# Patient Record
Sex: Male | Born: 1985 | Race: White | Hispanic: No | Marital: Single | Smoking: Current every day smoker
Health system: Southern US, Community
[De-identification: ages and names within clinical notes are randomized; demographics above are authoritative.]

## PROBLEM LIST (undated history)

## (undated) DIAGNOSIS — F909 Attention-deficit hyperactivity disorder, unspecified type: Secondary | ICD-10-CM

## (undated) DIAGNOSIS — F1910 Other psychoactive substance abuse, uncomplicated: Secondary | ICD-10-CM

## (undated) DIAGNOSIS — F4310 Post-traumatic stress disorder, unspecified: Secondary | ICD-10-CM

## (undated) DIAGNOSIS — R569 Unspecified convulsions: Secondary | ICD-10-CM

## (undated) DIAGNOSIS — K5090 Crohn's disease, unspecified, without complications: Secondary | ICD-10-CM

## (undated) DIAGNOSIS — S92901A Unspecified fracture of right foot, initial encounter for closed fracture: Secondary | ICD-10-CM

## (undated) HISTORY — DX: Crohn's disease, unspecified, without complications: K50.90

## (undated) HISTORY — PX: OTHER SURGICAL HISTORY: SHX169

## (undated) HISTORY — PX: GALLBLADDER SURGERY: SHX652

---
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
## 2007-08-01 ENCOUNTER — Emergency Department (HOSPITAL_COMMUNITY): Admission: EM | Admit: 2007-08-01 | Discharge: 2007-08-01 | Payer: Self-pay | Admitting: Emergency Medicine

## 2008-08-22 ENCOUNTER — Emergency Department (HOSPITAL_COMMUNITY): Admission: EM | Admit: 2008-08-22 | Discharge: 2008-08-22 | Payer: Self-pay | Admitting: Family Medicine

## 2008-10-10 ENCOUNTER — Emergency Department (HOSPITAL_COMMUNITY): Admission: EM | Admit: 2008-10-10 | Discharge: 2008-10-10 | Payer: Self-pay | Admitting: Family Medicine

## 2008-12-19 ENCOUNTER — Emergency Department (HOSPITAL_COMMUNITY): Admission: EM | Admit: 2008-12-19 | Discharge: 2008-12-19 | Payer: Self-pay | Admitting: Family Medicine

## 2009-01-09 ENCOUNTER — Emergency Department (HOSPITAL_COMMUNITY): Admission: EM | Admit: 2009-01-09 | Discharge: 2009-01-09 | Payer: Self-pay | Admitting: Emergency Medicine

## 2009-05-22 ENCOUNTER — Ambulatory Visit: Payer: Self-pay | Admitting: Family Medicine

## 2009-05-22 ENCOUNTER — Encounter: Payer: Self-pay | Admitting: Sports Medicine

## 2009-05-22 DIAGNOSIS — K5090 Crohn's disease, unspecified, without complications: Secondary | ICD-10-CM

## 2009-05-22 DIAGNOSIS — G43909 Migraine, unspecified, not intractable, without status migrainosus: Secondary | ICD-10-CM

## 2009-05-22 DIAGNOSIS — F988 Other specified behavioral and emotional disorders with onset usually occurring in childhood and adolescence: Secondary | ICD-10-CM | POA: Insufficient documentation

## 2009-05-22 LAB — CONVERTED CEMR LAB
ALT: 47 units/L (ref 0–53)
AST: 20 units/L (ref 0–37)
Albumin: 4.5 g/dL (ref 3.5–5.2)
Alkaline Phosphatase: 55 units/L (ref 39–117)
BUN: 12 mg/dL (ref 6–23)
Basophils Absolute: 0 10*3/uL (ref 0.0–0.1)
Basophils Relative: 0 % (ref 0–1)
CO2: 25 meq/L (ref 19–32)
Calcium: 9.4 mg/dL (ref 8.4–10.5)
Chloride: 102 meq/L (ref 96–112)
Creatinine, Ser: 0.89 mg/dL (ref 0.40–1.50)
Eosinophils Absolute: 0.3 10*3/uL (ref 0.0–0.7)
Eosinophils Relative: 2 % (ref 0–5)
Glucose, Bld: 101 mg/dL — ABNORMAL HIGH (ref 70–99)
H Pylori IgG: NEGATIVE
HCT: 47.7 % (ref 39.0–52.0)
Hemoglobin: 16.5 g/dL (ref 13.0–17.0)
Lipase: 12 units/L (ref 0–75)
Lymphocytes Relative: 27 % (ref 12–46)
Lymphs Abs: 3.5 10*3/uL (ref 0.7–4.0)
MCHC: 34.6 g/dL (ref 30.0–36.0)
MCV: 97.5 fL (ref 78.0–100.0)
Monocytes Absolute: 0.9 10*3/uL (ref 0.1–1.0)
Monocytes Relative: 7 % (ref 3–12)
Neutro Abs: 8.5 10*3/uL — ABNORMAL HIGH (ref 1.7–7.7)
Neutrophils Relative %: 64 % (ref 43–77)
Platelets: 327 10*3/uL (ref 150–400)
Potassium: 4.5 meq/L (ref 3.5–5.3)
RBC: 4.89 M/uL (ref 4.22–5.81)
RDW: 12.7 % (ref 11.5–15.5)
Sodium: 142 meq/L (ref 135–145)
Total Bilirubin: 0.3 mg/dL (ref 0.3–1.2)
Total Protein: 6.9 g/dL (ref 6.0–8.3)
WBC: 13.2 10*3/uL — ABNORMAL HIGH (ref 4.0–10.5)

## 2009-05-23 ENCOUNTER — Encounter: Admission: RE | Admit: 2009-05-23 | Discharge: 2009-05-23 | Payer: Self-pay | Admitting: Sports Medicine

## 2009-06-20 ENCOUNTER — Emergency Department (HOSPITAL_COMMUNITY): Admission: EM | Admit: 2009-06-20 | Discharge: 2009-06-21 | Payer: Self-pay | Admitting: Emergency Medicine

## 2009-06-25 ENCOUNTER — Emergency Department (HOSPITAL_COMMUNITY): Admission: EM | Admit: 2009-06-25 | Discharge: 2009-06-25 | Payer: Self-pay | Admitting: Emergency Medicine

## 2009-07-19 ENCOUNTER — Emergency Department (HOSPITAL_COMMUNITY): Admission: EM | Admit: 2009-07-19 | Discharge: 2009-07-19 | Payer: Self-pay | Admitting: Emergency Medicine

## 2009-09-27 ENCOUNTER — Emergency Department (HOSPITAL_COMMUNITY): Admission: EM | Admit: 2009-09-27 | Discharge: 2009-09-27 | Payer: Self-pay | Admitting: Emergency Medicine

## 2010-02-11 ENCOUNTER — Emergency Department (HOSPITAL_COMMUNITY): Admission: EM | Admit: 2010-02-11 | Discharge: 2010-02-11 | Payer: Self-pay | Admitting: Emergency Medicine

## 2010-03-21 ENCOUNTER — Emergency Department (HOSPITAL_COMMUNITY): Admission: EM | Admit: 2010-03-21 | Discharge: 2010-03-21 | Payer: Self-pay | Admitting: Emergency Medicine

## 2010-03-22 ENCOUNTER — Telehealth: Payer: Self-pay | Admitting: Physician Assistant

## 2010-04-05 ENCOUNTER — Telehealth (INDEPENDENT_AMBULATORY_CARE_PROVIDER_SITE_OTHER): Payer: Self-pay | Admitting: *Deleted

## 2010-06-18 ENCOUNTER — Emergency Department (HOSPITAL_COMMUNITY)
Admission: EM | Admit: 2010-06-18 | Discharge: 2010-06-18 | Payer: Self-pay | Source: Home / Self Care | Admitting: Emergency Medicine

## 2010-06-20 LAB — D-DIMER, QUANTITATIVE: D-Dimer, Quant: 0.32 ug/mL-FEU (ref 0.00–0.48)

## 2010-06-24 ENCOUNTER — Encounter: Payer: Self-pay | Admitting: Internal Medicine

## 2010-06-24 ENCOUNTER — Emergency Department (HOSPITAL_COMMUNITY)
Admission: EM | Admit: 2010-06-24 | Discharge: 2010-06-24 | Payer: Self-pay | Source: Home / Self Care | Admitting: Emergency Medicine

## 2010-06-26 LAB — POCT I-STAT, CHEM 8
BUN: 10 mg/dL (ref 6–23)
Calcium, Ion: 1.12 mmol/L (ref 1.12–1.32)
Chloride: 107 mEq/L (ref 96–112)
Creatinine, Ser: 1.3 mg/dL (ref 0.4–1.5)
Glucose, Bld: 92 mg/dL (ref 70–99)
HCT: 48 % (ref 39.0–52.0)
Hemoglobin: 16.3 g/dL (ref 13.0–17.0)
Potassium: 4.2 mEq/L (ref 3.5–5.1)
Sodium: 142 mEq/L (ref 135–145)
TCO2: 27 mmol/L (ref 0–100)

## 2010-06-26 LAB — POCT CARDIAC MARKERS
CKMB, poc: <1 ng/mL — ABNORMAL LOW (ref 1.0–8.0)
Myoglobin, poc: 73.3 ng/mL (ref 12–200)
Troponin i, poc: <0.05 ng/mL (ref 0.00–0.09)

## 2010-07-03 NOTE — Assessment & Plan Note (Signed)
 Summary: np/father michael Marxen sees dr t/eo   Vital Signs:  Patient profile:   25 year old male Weight:      230 pounds Temp:     98.3 degrees F oral Pulse rate:   114 / minute Pulse rhythm:   regular BP sitting:   150 / 84  (right arm) Cuff size:   large  Vitals Entered By: Bascom Pica CMA (May 22, 2009 1:56 PM)  Primary Care Provider:  Debby Petties, MD   History of Present Illness: 25yo M, new pt eval, complaining of chronic diarrhea.  Worse over 6 mos but noted at first when he came back from [place] after a 2 year stay in 2008.  Associated with belly pain and bloating prior to bowel movement, loose and mucusy, one episode of hematochezia but no others, also has RUQ pain.  No known association with glutens, does drink lots of milk to ease his epigastric pain.  No contaminated water sources.  No fevers/chills.  Has not been losing weight.  No vomiting.  ADHD: Stable  Migraines:  [facility]ed by [facility], states he usually doesn't have enough money to go to appts.  Current Medications (verified): 1)  Methylphenidate  Hcl 20 Mg Tabs (Methylphenidate  Hcl) .... One Tab By Mouth Bid 2)  Atenolol 100 Mg Tabs (Atenolol) .... One Tab By Mouth Daily 3)  Topamax  100 Mg Tabs (Topiramate ) .... One Tab By Mouth Daily 4)  Fioricet 50-325-40 Mg Tabs (Butalbital-Apap-Caffeine) .... One Tab Po Q4-6h As Needed For Ha 5)  Imitrex Statdose System 4 Mg/0.5ml Kit (Sumatriptan Succinate) .... Injected At The First Sign of Migraines 6)  Prilosec 40 Mg Cpdr (Omeprazole ) .... One Tab By Mouth Daily  Allergies (verified): No Known Drug Allergies  Past History:  Past Medical History: Migraines (has been to neurologist and headache center) ADHD  Past Surgical History: None  Family History: Both parents alive. Chronic pancreatitis and ETOH abuse in father. Hx diabetes.  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  Review of Systems       See HPI  Physical Exam  General:  Well-developed,well-nourished,in no acute distress; alert,appropriate and cooperative throughout examination Head:  Normocephalic and atraumatic without obvious abnormalities. No apparent alopecia or balding. Eyes:  No corneal or conjunctival inflammation noted. EOMI. Perrla. Ears:  External ear exam shows no significant lesions or deformities.  Otoscopic examination reveals clear canals, tympanic membranes are intact bilaterally without bulging, retraction, inflammation or discharge. Hearing is grossly normal bilaterally. Nose:  External nasal examination shows no deformity or inflammation. Nasal mucosa are pink and moist without lesions or exudates. Mouth:  Oral mucosa and oropharynx without lesions or exudates.  Teeth in good repair. Lungs:  Normal respiratory effort, chest expands symmetrically. Lungs are clear to auscultation, no crackles or wheezes. Heart:  Normal rate and regular rhythm. S1 and S2 normal without gallop, murmur, click, rub or other extra sounds. Abdomen:  Bowel sounds positive,abdomen soft and without masses, organomegaly or hernias noted.  TTP RUQ and epigastrium. Msk:  No deformity or scoliosis noted of thoracic or lumbar spine.   Extremities:  No clubbing, cyanosis, edema, or deformity noted with normal full range of motion of all joints.   Neurologic:  No cranial nerve deficits noted. Station and gait are normal. Plantar reflexes are down-going bilaterally. DTRs are symmetrical throughout. Sensory, motor and coordinative functions appear intact. Skin:  Intact without suspicious lesions or  rashes   Impression & Recommendations:  Problem # 1:  DIARRHEA, CHRONIC (ICD-787.91) Assessment New Present >6 months, hold lactose containing foods, will check below labs and imaging studies.  Stool O&P ordered as future study.  Suspect gallbladder etiology vs. lactose intolerance vs. IBD.  If all  studies neg can consider HIDA.  If negative could pursue workup for rarer etiologies such as Celiac sprue, Leishmaniasis.  Would also consider course of Flagyl if everything negative.  Will start prilosec 40 once daily.  Orders: Comp Met-[facility] [phone]) CBC w/Diff-[facility] [phone]) H pylori-[facility] [phone]) Lipase-[facility] [phone]) Sed Rate (ESR)-[facility] [phone]) Ultrasound (Ultrasound)  Problem # 2:  MIGRAINE HEADACHE (ICD-346.90) Assessment: New Didn't take any of his medicines today.    His updated medication list for this problem includes:    Atenolol 100 Mg Tabs (Atenolol) ..... One tab by mouth daily    Fioricet 50-325-40 Mg Tabs (Butalbital-apap-caffeine) ..... One tab po q4-6h as needed for ha    Imitrex Statdose System 4 Mg/0.5ml Kit (Sumatriptan succinate) ..... Injected at the first sign of migraines  Problem # 3:  ATTENTION DEFICIT DISORDER, ADULT (ICD-314.00) Assessment: New Stable, on ritalin .  Complete Medication List: 1)  Methylphenidate  Hcl 20 Mg Tabs (Methylphenidate  hcl) .... One tab by mouth bid 2)  Atenolol 100 Mg Tabs (Atenolol) .... One tab by mouth daily 3)  Topamax  100 Mg Tabs (Topiramate ) .... One tab by mouth daily 4)  Fioricet 50-325-40 Mg Tabs (Butalbital-apap-caffeine) .... One tab po q4-6h as needed for ha 5)  Imitrex Statdose System 4 Mg/0.5ml Kit (Sumatriptan succinate) .... Injected at the first sign of migraines 6)  Prilosec 40 Mg Cpdr (Omeprazole ) .... One tab by mouth daily  Other Orders: Future Orders: Stool Giardia/Cryptosporidium-[facility] ([id number]) ... 05/22/2010 Stool, WBC/Lactoferrin-[facility] ([id number]) ... 05/22/2010  Patient Instructions: 1)  Great to see you today, 2)  We will check some bloodwork, I also need you to provide a stool sample (you will collect at home then drop it off at the lab). 3)  Ultrasound of your abdomen. 4)  Stop drinking so much milk, ok to drink Lactaid milk. 5)  Come back to see me in 1 month to go over all the results and the  ultrasound results. Will reassess your symptoms then and proceed with further testing as needed. 6)  Prilosec 40mg  daily. 7)  -Dr. ONEIDA. Prescriptions: PRILOSEC 40 MG CPDR (OMEPRAZOLE ) One tab by mouth daily  #30 x 6   Entered and Authorized by:   Debby Petties MD   Signed by:   Debby Petties MD on 05/22/2009   Method used:   Electronically to        [id number] (retail)       [address]       [place], [place]  [address]       Ph: [phone] or [phone]       Fax: [phone]   RxID:   [id number]   Laboratory Results   Blood Tests   Date/Time Received: May 22, 2009 3:24 PM  Date/Time Reported: May 22, 2009 4:57 PM   SED rate: 0 mm/hr  H. pylori: negative Comments: ...........test performed by...........SABRAArland Morel, CMA      Flu Vaccine Result Date:  06/03/2008 Flu Vaccine Result:  given Flu Vaccine Next Due:  1 yr TD Result Date:  06/03/2008 TD Result:  given TD Next Due:  10 yr

## 2010-07-03 NOTE — Progress Notes (Signed)
Summary: resch appt  Phone Note Call from Patient Call back at [phone]   Caller: Patient Call For: Mike Gip, PA Reason for Call: Talk to Nurse Summary of Call: this pt cancelled his appt for today with Amy due to the fact that he does not have his insurance information and concerned about proper billing... wanted to resch, but Amy does not have schedule open... pt would like a call from Amy's nurse to resch when available Initial call taken by: Vallarie Mare,  March 22, 2010 8:52 AM  Follow-up for Phone Call        Called pt's cell phone and LM for him to call our office and reschedule.  I advised him Amy doesn't have a scheduled in yet for Nov.  I let him know o ur Nurse Practioner has appts open for next week, week of 03-26-10.   Follow-up by: Joselyn Glassman,  March 23, 2010 9:08 AM

## 2010-07-03 NOTE — Progress Notes (Signed)
[facility]  Phone Note Outgoing Call   Call placed by: June McMurray CMA Duncan Dull),  April 05, 2010 4:07 PM Call placed to: Patient Action Taken: Phone Call Completed Summary of Call: Called patient to reschedule follow-up appointment from hospital [facility], [place], patient stated that [facility] and did not require our services.

## 2010-08-06 ENCOUNTER — Emergency Department (HOSPITAL_COMMUNITY)
Admission: EM | Admit: 2010-08-06 | Discharge: 2010-08-06 | Disposition: A | Discharge status: Home / Self Care | Payer: Self-pay | Attending: Emergency Medicine | Admitting: Emergency Medicine

## 2010-08-19 LAB — CSF CELL COUNT WITH DIFFERENTIAL
RBC Count, CSF: 1 /mm3 — ABNORMAL HIGH
RBC Count, CSF: 7 /mm3 — ABNORMAL HIGH
Tube #: 1
Tube #: 4
WBC, CSF: 1 /mm3 (ref 0–5)
WBC, CSF: 6 /mm3 — ABNORMAL HIGH (ref 0–5)

## 2010-08-19 LAB — DIFFERENTIAL
Basophils Absolute: 0 10*3/uL (ref 0.0–0.1)
Basophils Relative: 0 % (ref 0–1)
Eosinophils Absolute: 0.1 10*3/uL (ref 0.0–0.7)
Eosinophils Relative: 1 % (ref 0–5)
Lymphocytes Relative: 9 % — ABNORMAL LOW (ref 12–46)
Lymphs Abs: 1.8 10*3/uL (ref 0.7–4.0)
Monocytes Absolute: 0.9 10*3/uL (ref 0.1–1.0)
Monocytes Relative: 5 % (ref 3–12)
Neutro Abs: 16.2 10*3/uL — ABNORMAL HIGH (ref 1.7–7.7)
Neutrophils Relative %: 85 % — ABNORMAL HIGH (ref 43–77)

## 2010-08-19 LAB — BASIC METABOLIC PANEL
BUN: 11 mg/dL (ref 6–23)
CO2: 25 mEq/L (ref 19–32)
Calcium: 8.9 mg/dL (ref 8.4–10.5)
Chloride: 101 mEq/L (ref 96–112)
Creatinine, Ser: 0.93 mg/dL (ref 0.4–1.5)
GFR calc Af Amer: >60 mL/min (ref >60)
GFR calc non Af Amer: >60 mL/min (ref >60)
Glucose, Bld: 126 mg/dL — ABNORMAL HIGH (ref 70–99)
Potassium: 3.8 mEq/L (ref 3.5–5.1)
Sodium: 135 mEq/L (ref 135–145)

## 2010-08-19 LAB — PROTEIN AND GLUCOSE, CSF
Glucose, CSF: 72 mg/dL (ref 43–76)
Total  Protein, CSF: 39 mg/dL (ref 15–45)

## 2010-08-19 LAB — CSF CULTURE W GRAM STAIN: Culture: NO GROWTH

## 2010-08-19 LAB — CBC
HCT: 45.9 % (ref 39.0–52.0)
Hemoglobin: 15.9 g/dL (ref 13.0–17.0)
MCHC: 34.6 g/dL (ref 30.0–36.0)
MCV: 99.3 fL (ref 78.0–100.0)
Platelets: 292 10*3/uL (ref 150–400)
RBC: 4.62 MIL/uL (ref 4.22–5.81)
RDW: 12.8 % (ref 11.5–15.5)
WBC: 19 10*3/uL — ABNORMAL HIGH (ref 4.0–10.5)

## 2010-08-21 ENCOUNTER — Encounter: Payer: Self-pay | Admitting: Sports Medicine

## 2010-08-21 ENCOUNTER — Ambulatory Visit (INDEPENDENT_AMBULATORY_CARE_PROVIDER_SITE_OTHER): Payer: Self-pay | Admitting: Sports Medicine

## 2010-08-21 DIAGNOSIS — K5090 Crohn's disease, unspecified, without complications: Secondary | ICD-10-CM

## 2010-08-21 DIAGNOSIS — J069 Acute upper respiratory infection, unspecified: Secondary | ICD-10-CM

## 2010-08-21 DIAGNOSIS — F988 Other specified behavioral and emotional disorders with onset usually occurring in childhood and adolescence: Secondary | ICD-10-CM

## 2010-08-21 MED ORDER — METHYLPHENIDATE HCL 20 MG PO TABS: 20.0000 mg | ORAL_TABLET | Freq: Two times a day (BID) | ORAL | Status: DC

## 2010-08-21 NOTE — Assessment & Plan Note (Signed)
Refilled ritalin

## 2010-08-21 NOTE — Progress Notes (Signed)
  Subjective:    Patient ID: Justin [name], male    DOB: [birth year], 25 y.o.   MRN: [id number]  HPI URI Symptoms Onset: 1 week Description: cough, nasal congestion, sniffles  Symptoms Nasal discharge: clear Fever: no Sore throat:no  Cough: yes, brownish sputum Wheezing: no Ear pain: minimal GI symptoms: diarrhea, but has dx Crohns disease. Sick contacts: no  Red Flags  Stiff neck: NO Dyspnea: NO Rash: NO Swallowing difficulty: NO  Sinusitis Risk Factors Headache/face pain: NO Double sickening: NO tooth pain: NO  Allergy Risk Factors Sneezing: YES Itchy scratchy throat: NO Seasonal symptoms: YES  Flu Risk Factors Headache: NO muscle aches: NO severe fatigue: NO  We did discuss his father's recent and sudden death.  He is coping well and able to discuss this.   Review of Systems     Objective:   Physical Exam  Constitutional: He appears well-developed and well-nourished. No distress.  HENT:  Head: Normocephalic and atraumatic.  Right Ear: External ear normal.  Left Ear: External ear normal.  Mouth/Throat: Oropharynx is clear and moist. No oropharyngeal exudate.  Eyes: Conjunctivae and EOM are normal. Pupils are equal, round, and reactive to light.  Neck: Normal range of motion. Neck supple. No JVD present.  Cardiovascular: Normal rate, regular rhythm, normal heart sounds and intact distal pulses.  Exam reveals no gallop and no friction rub.   No murmur heard. Pulmonary/Chest: Effort normal and breath sounds normal. No stridor. No respiratory distress. He has no wheezes. He has no rales. He exhibits no tenderness.  Abdominal: Soft.  Lymphadenopathy:    He has no cervical adenopathy.  Skin: Skin is warm and dry.          Assessment & Plan:

## 2010-08-21 NOTE — Patient Instructions (Addendum)
You have a viral infection. Take theraflu as directed, including the nighttime pack. -Dr. Karie Schwalbe.

## 2010-08-21 NOTE — Assessment & Plan Note (Signed)
Recommended OTC agents. Handout given. RTC prn.

## 2010-08-21 NOTE — Assessment & Plan Note (Signed)
Has a GI doctor, will be starting mesalamine soon.

## 2010-09-08 LAB — GLUCOSE, CAPILLARY: Glucose-Capillary: 102 mg/dL — ABNORMAL HIGH (ref 70–99)

## 2010-10-10 ENCOUNTER — Telehealth: Payer: Self-pay | Admitting: Sports Medicine

## 2010-10-10 MED ORDER — METHYLPHENIDATE HCL 20 MG PO TABS: 20.0000 mg | ORAL_TABLET | Freq: Two times a day (BID) | ORAL | Status: DC

## 2010-10-10 NOTE — Telephone Encounter (Signed)
Please refill rx and contact pt when ready for pickup

## 2010-10-10 NOTE — Telephone Encounter (Signed)
Script at front

## 2010-10-12 ENCOUNTER — Encounter: Payer: Self-pay | Admitting: Sports Medicine

## 2010-10-12 ENCOUNTER — Ambulatory Visit (INDEPENDENT_AMBULATORY_CARE_PROVIDER_SITE_OTHER): Payer: Self-pay | Admitting: Sports Medicine

## 2010-10-12 DIAGNOSIS — F988 Other specified behavioral and emotional disorders with onset usually occurring in childhood and adolescence: Secondary | ICD-10-CM

## 2010-10-12 DIAGNOSIS — K5090 Crohn's disease, unspecified, without complications: Secondary | ICD-10-CM

## 2010-10-12 DIAGNOSIS — G43909 Migraine, unspecified, not intractable, without status migrainosus: Secondary | ICD-10-CM

## 2010-10-12 MED ORDER — MESALAMINE 400 MG PO TBEC: 400.0000 mg | DELAYED_RELEASE_TABLET | Freq: Three times a day (TID) | ORAL | Status: DC

## 2010-10-12 MED ORDER — RANITIDINE HCL 150 MG PO TABS: 150.0000 mg | ORAL_TABLET | Freq: Two times a day (BID) | ORAL | Status: DC

## 2010-10-12 MED ORDER — HYDROCODONE-ACETAMINOPHEN 10-325 MG PO TABS: 1.0000 | ORAL_TABLET | ORAL | Status: AC | PRN

## 2010-10-12 MED ORDER — TOPIRAMATE 100 MG PO TABS: 150.0000 mg | ORAL_TABLET | Freq: Every day | ORAL | Status: DC

## 2010-10-12 MED ORDER — PREDNISONE 5 MG PO TABS: 5.0000 mg | ORAL_TABLET | Freq: Every day | ORAL | Status: AC

## 2010-10-12 NOTE — Patient Instructions (Signed)
Great to see you Abdullahi. Let me know if you need anything else. Call us for refills.  Ihor Austin. Benjamin Stain, M.D.

## 2010-10-12 NOTE — Progress Notes (Signed)
  Subjective:    Patient ID: Justin [name], male    DOB: [birth year], 25 y.o.   MRN: [id number]  HPI 25 yo male here for fu.  Crohn's Disease:  [facility]  On Mesalamine and prednisone.  Symptoms overall controlled, no abd pain, no blood diarrhea today.  ADHD:  Stable on ritalin 20 BID.  Migraines:  Well controlled with topamax, hydrocodone for breakthrough.  Off imitrex 2/2 Crohn's meds.   Review of Systems    See HPI Objective:   Physical Exam  Constitutional: He appears well-developed and well-nourished. No distress.  Cardiovascular: Normal rate, regular rhythm and normal heart sounds.  Exam reveals no gallop and no friction rub.   No murmur heard. Pulmonary/Chest: Effort normal and breath sounds normal. No respiratory distress. He has no wheezes. He has no rales. He exhibits no tenderness.  Skin: Skin is warm and dry.          Assessment & Plan:

## 2010-10-12 NOTE — Assessment & Plan Note (Signed)
Refilled Ritalin on 5/9, 1 month supply, may get refills q monthly.

## 2010-10-12 NOTE — Assessment & Plan Note (Signed)
[facility] On mesalamine and prednisone.

## 2010-10-12 NOTE — Assessment & Plan Note (Signed)
Controlled with Topamax and hydrocodone.

## 2010-11-05 ENCOUNTER — Telehealth: Payer: Self-pay | Admitting: Sports Medicine

## 2010-11-05 NOTE — Telephone Encounter (Signed)
Is going out of town on Wed and needs refill on his Hydrocodone 10mg  & methyphenidate

## 2010-11-05 NOTE — Telephone Encounter (Signed)
The new practice policy is pts need to come in for a office visit for controlled substances.  I can no longer leave this in an envelope in the front so have him come see me at my next avail slot and I can write a script.  We will not be assessing other problems during this office visit. 

## 2010-11-06 ENCOUNTER — Telehealth: Payer: Self-pay | Admitting: Sports Medicine

## 2010-11-06 MED ORDER — METHYLPHENIDATE HCL 20 MG PO TABS: 20.0000 mg | ORAL_TABLET | Freq: Two times a day (BID) | ORAL | Status: DC

## 2010-11-06 MED ORDER — HYDROCODONE-ACETAMINOPHEN 10-325 MG PO TABS: 1.0000 | ORAL_TABLET | Freq: Four times a day (QID) | ORAL | Status: AC | PRN

## 2010-11-06 NOTE — Telephone Encounter (Signed)
err

## 2010-11-06 NOTE — Telephone Encounter (Signed)
In script at front.

## 2010-11-06 NOTE — Telephone Encounter (Signed)
LVM for patient to call back to inform of below 

## 2010-11-06 NOTE — Telephone Encounter (Signed)
Spoke with patient and he stated that he was prescribed hydrocodone before. He was prescribed it on 5/11 by you

## 2010-11-06 NOTE — Telephone Encounter (Signed)
Don't see hydrocodone on his med list.  Methylphenidate in envelope at front.

## 2010-12-17 ENCOUNTER — Telehealth: Payer: Self-pay | Admitting: Family Medicine

## 2010-12-17 NOTE — Telephone Encounter (Signed)
Needs refills of Hydrocodone and Methylthenidate, please call when they are ready for pick up.

## 2010-12-17 NOTE — Telephone Encounter (Signed)
Called pt and lvm for him to call back.  He will need to make an appt before any meds can be refilled.Loralee Pacas [place]

## 2010-12-29 ENCOUNTER — Encounter: Payer: Self-pay | Admitting: Family Medicine

## 2011-01-01 ENCOUNTER — Ambulatory Visit: Payer: Self-pay | Admitting: Family Medicine

## 2011-01-16 ENCOUNTER — Emergency Department (HOSPITAL_COMMUNITY)
Admission: EM | Admit: 2011-01-16 | Discharge: 2011-01-16 | Disposition: A | Discharge status: Home / Self Care | Payer: Non-veteran care | Attending: Emergency Medicine | Admitting: Emergency Medicine

## 2011-01-16 DIAGNOSIS — K2270 Barrett's esophagus without dysplasia: Secondary | ICD-10-CM | POA: Insufficient documentation

## 2011-01-16 DIAGNOSIS — R1110 Vomiting, unspecified: Secondary | ICD-10-CM | POA: Insufficient documentation

## 2011-01-16 DIAGNOSIS — K219 Gastro-esophageal reflux disease without esophagitis: Secondary | ICD-10-CM | POA: Insufficient documentation

## 2011-01-16 DIAGNOSIS — R1032 Left lower quadrant pain: Secondary | ICD-10-CM | POA: Insufficient documentation

## 2011-01-16 LAB — COMPREHENSIVE METABOLIC PANEL
Alkaline Phosphatase: 54 U/L (ref 39–117)
BUN: 9 mg/dL (ref 6–23)
CO2: 26 mEq/L (ref 19–32)
Calcium: 9.2 mg/dL (ref 8.4–10.5)
GFR calc Af Amer: >60 mL/min (ref >60)
GFR calc non Af Amer: >60 mL/min (ref >60)
Glucose, Bld: 97 mg/dL (ref 70–99)
Potassium: 4.1 mEq/L (ref 3.5–5.1)
Total Protein: 6.9 g/dL (ref 6.0–8.3)

## 2011-01-16 LAB — LIPASE, BLOOD: Lipase: 18 U/L (ref 11–59)

## 2011-01-16 LAB — DIFFERENTIAL
Eosinophils Absolute: 0.3 10*3/uL (ref 0.0–0.7)
Eosinophils Relative: 4 % (ref 0–5)
Lymphocytes Relative: 37 % (ref 12–46)
Lymphs Abs: 2.7 10*3/uL (ref 0.7–4.0)
Monocytes Relative: 11 % (ref 3–12)

## 2011-01-16 LAB — URINALYSIS, ROUTINE W REFLEX MICROSCOPIC
Bilirubin Urine: NEGATIVE
Glucose, UA: NEGATIVE mg/dL
Hgb urine dipstick: NEGATIVE
Ketones, ur: NEGATIVE mg/dL
Protein, ur: NEGATIVE mg/dL

## 2011-01-16 LAB — CBC
HCT: 45.4 % (ref 39.0–52.0)
MCH: 34 pg (ref 26.0–34.0)
MCV: 96.6 fL (ref 78.0–100.0)
Platelets: 247 10*3/uL (ref 150–400)
RDW: 12.4 % (ref 11.5–15.5)

## 2011-04-28 ENCOUNTER — Encounter (HOSPITAL_COMMUNITY): Payer: Self-pay | Admitting: *Deleted

## 2011-04-28 ENCOUNTER — Emergency Department (INDEPENDENT_AMBULATORY_CARE_PROVIDER_SITE_OTHER)
Admission: EM | Admit: 2011-04-28 | Discharge: 2011-04-28 | Disposition: A | Discharge status: Home / Self Care | Payer: Non-veteran care | Source: Home / Self Care | Attending: Emergency Medicine | Admitting: Emergency Medicine

## 2011-04-28 DIAGNOSIS — J069 Acute upper respiratory infection, unspecified: Secondary | ICD-10-CM

## 2011-04-28 HISTORY — DX: Attention-deficit hyperactivity disorder, unspecified type: F90.9

## 2011-04-28 MED ORDER — PREDNISONE (PAK) 10 MG PO TABS: 20.0000 mg | ORAL_TABLET | Freq: Every day | ORAL | Status: AC

## 2011-04-28 MED ORDER — GUAIFENESIN-CODEINE 100-10 MG/5ML PO SYRP: 5.0000 mL | ORAL_SOLUTION | Freq: Three times a day (TID) | ORAL | Status: AC | PRN

## 2011-04-28 NOTE — ED Provider Notes (Signed)
History     CSN: [id number] Arrival date & time: 04/28/2011 11:18 AM   First MD Initiated Contact with Patient 04/28/11 1047      Chief Complaint  Patient presents with  . Fever    friday onset of fever/cough/congestion - onset of right rib cage pain this am worse with coughing   . Nasal Congestion  . Cough    (Consider location/radiation/quality/duration/timing/severity/associated sxs/prior treatment) Patient is a 25 y.o. male presenting with fever and cough. The history is provided by the patient.  Fever Primary symptoms of the febrile illness include fever, cough and vomiting. Primary symptoms do not include headaches, wheezing, shortness of breath, nausea, arthralgias or rash. The current episode started 2 days ago. This is a new problem.  Cough Associated symptoms include sore throat. Pertinent negatives include no ear pain, no headaches, no shortness of breath and no wheezing.    Past Medical History  Diagnosis Date  . Crohn's disease   . ADHD (attention deficit hyperactivity disorder)     History reviewed. No pertinent past surgical history.  History reviewed. No pertinent family history.  History  Substance Use Topics  . Smoking status: Never Smoker   . Smokeless tobacco: Not on file  . Alcohol Use: No      Review of Systems  Constitutional: Positive for fever.  HENT: Positive for congestion, sore throat and postnasal drip. Negative for ear pain.   Respiratory: Positive for cough. Negative for shortness of breath and wheezing.   Gastrointestinal: Positive for vomiting. Negative for nausea.  Musculoskeletal: Negative for arthralgias.  Skin: Negative for rash.  Neurological: Negative for headaches.    Allergies  Review of patient's allergies indicates no known allergies.  Home Medications   Current Outpatient Rx  Name Route Sig Dispense Refill  . ACETAMINOPHEN 325 MG PO TABS Oral Take 650 mg by mouth every 6 (six) hours as needed.      Marland Kitchen OVER THE  COUNTER MEDICATION  dayquil nyquil     . MESALAMINE 400 MG PO TBEC Oral Take 1 tablet (400 mg total) by mouth 3 (three) times daily. 90 tablet 11  . METHYLPHENIDATE HCL 20 MG PO TABS Oral Take 1 tablet (20 mg total) by mouth 2 (two) times daily. 60 tablet 0  . RANITIDINE HCL 150 MG PO TABS Oral Take 1 tablet (150 mg total) by mouth 2 (two) times daily. 60 tablet 1  . TOPIRAMATE 100 MG PO TABS Oral Take 1.5 tablets (150 mg total) by mouth daily. 45 tablet 11    BP 138/89  Pulse 106  Temp(Src) 98.6 F (37 C) (Oral)  Resp 18  SpO2 98%  Physical Exam  Nursing note and vitals reviewed. Constitutional: He appears well-developed.  Eyes: Pupils are equal, round, and reactive to light.  Neck: Neck supple.  Cardiovascular: Normal rate.  Exam reveals no gallop.   Pulmonary/Chest: Effort normal. No respiratory distress. He has no wheezes. He has no rales. He exhibits no tenderness.  Skin: Skin is warm.    ED Course  Procedures (including critical care time)  Labs Reviewed - No data to display No results found.   No diagnosis found.    MDM  COUGH- AND RHINITIS WITH FEVERS AT HOME- PLEURITIC CHEST PAIN- NORMAL EXMA-        Jimmie Molly, MD 04/28/11 1200

## 2012-02-28 ENCOUNTER — Emergency Department (INDEPENDENT_AMBULATORY_CARE_PROVIDER_SITE_OTHER)
Admission: EM | Admit: 2012-02-28 | Discharge: 2012-02-28 | Disposition: A | Discharge status: Nursing Home (Includes ICF) | Payer: Managed Care, Other (non HMO) | Source: Home / Self Care | Attending: Family Medicine | Admitting: Family Medicine

## 2012-02-28 ENCOUNTER — Emergency Department (HOSPITAL_COMMUNITY): Payer: Managed Care, Other (non HMO)

## 2012-02-28 ENCOUNTER — Emergency Department (HOSPITAL_COMMUNITY)
Admission: EM | Admit: 2012-02-28 | Discharge: 2012-02-28 | Disposition: A | Discharge status: Home / Self Care | Payer: Managed Care, Other (non HMO) | Attending: Emergency Medicine | Admitting: Emergency Medicine

## 2012-02-28 ENCOUNTER — Encounter (HOSPITAL_COMMUNITY): Payer: Self-pay | Admitting: Emergency Medicine

## 2012-02-28 ENCOUNTER — Encounter (HOSPITAL_COMMUNITY): Payer: Self-pay | Admitting: Family Medicine

## 2012-02-28 DIAGNOSIS — R109 Unspecified abdominal pain: Secondary | ICD-10-CM | POA: Insufficient documentation

## 2012-02-28 DIAGNOSIS — Z79899 Other long term (current) drug therapy: Secondary | ICD-10-CM | POA: Insufficient documentation

## 2012-02-28 DIAGNOSIS — K5010 Crohn's disease of large intestine without complications: Secondary | ICD-10-CM

## 2012-02-28 DIAGNOSIS — R52 Pain, unspecified: Secondary | ICD-10-CM

## 2012-02-28 DIAGNOSIS — K5090 Crohn's disease, unspecified, without complications: Secondary | ICD-10-CM | POA: Insufficient documentation

## 2012-02-28 DIAGNOSIS — R112 Nausea with vomiting, unspecified: Secondary | ICD-10-CM | POA: Insufficient documentation

## 2012-02-28 DIAGNOSIS — R197 Diarrhea, unspecified: Secondary | ICD-10-CM | POA: Insufficient documentation

## 2012-02-28 LAB — CBC WITH DIFFERENTIAL/PLATELET
Basophils Absolute: 0 10*3/uL (ref 0.0–0.1)
Basophils Relative: 0 % (ref 0–1)
Eosinophils Absolute: 0.1 10*3/uL (ref 0.0–0.7)
Hemoglobin: 15.2 g/dL (ref 13.0–17.0)
MCHC: 34.2 g/dL (ref 30.0–36.0)
Monocytes Relative: 5 % (ref 3–12)
Neutro Abs: 10.1 10*3/uL — ABNORMAL HIGH (ref 1.7–7.7)
Neutrophils Relative %: 74 % (ref 43–77)
Platelets: 289 10*3/uL (ref 150–400)

## 2012-02-28 LAB — COMPREHENSIVE METABOLIC PANEL WITH GFR
ALT: 14 U/L (ref 0–53)
AST: 10 U/L (ref 0–37)
Albumin: 4.1 g/dL (ref 3.5–5.2)
Alkaline Phosphatase: 47 U/L (ref 39–117)
BUN: 10 mg/dL (ref 6–23)
CO2: 28 meq/L (ref 19–32)
Calcium: 9.7 mg/dL (ref 8.4–10.5)
Chloride: 105 meq/L (ref 96–112)
Creatinine, Ser: 0.93 mg/dL (ref 0.50–1.35)
GFR calc Af Amer: >90 mL/min
GFR calc non Af Amer: >90 mL/min
Glucose, Bld: 96 mg/dL (ref 70–99)
Potassium: 4.1 meq/L (ref 3.5–5.1)
Sodium: 142 meq/L (ref 135–145)
Total Bilirubin: 0.4 mg/dL (ref 0.3–1.2)
Total Protein: 7 g/dL (ref 6.0–8.3)

## 2012-02-28 MED ORDER — IOHEXOL 300 MG/ML  SOLN
80.0000 mL | Freq: Once | INTRAMUSCULAR | Status: AC | PRN
  Administered 2012-02-28: 80 mL via INTRAVENOUS

## 2012-02-28 MED ORDER — ONDANSETRON 4 MG PO TBDP
ORAL_TABLET | ORAL | Status: AC
  Filled 2012-02-28: qty 1

## 2012-02-28 MED ORDER — ONDANSETRON 8 MG PO TBDP: 8.0000 mg | ORAL_TABLET | Freq: Three times a day (TID) | ORAL | Status: DC | PRN

## 2012-02-28 MED ORDER — FENTANYL CITRATE 0.05 MG/ML IJ SOLN
50.0000 ug | Freq: Once | INTRAMUSCULAR | Status: AC
  Administered 2012-02-28: 50 ug via INTRAVENOUS
  Filled 2012-02-28: qty 2

## 2012-02-28 MED ORDER — SODIUM CHLORIDE 0.9 % IV BOLUS (SEPSIS)
500.0000 mL | Freq: Once | INTRAVENOUS | Status: AC
  Administered 2012-02-28: 500 mL via INTRAVENOUS

## 2012-02-28 MED ORDER — ONDANSETRON HCL 4 MG/2ML IJ SOLN
4.0000 mg | Freq: Once | INTRAMUSCULAR | Status: AC
  Administered 2012-02-28: 4 mg via INTRAVENOUS
  Filled 2012-02-28: qty 2

## 2012-02-28 MED ORDER — ONDANSETRON 4 MG PO TBDP
8.0000 mg | ORAL_TABLET | Freq: Once | ORAL | Status: AC
  Administered 2012-02-28: 8 mg via ORAL

## 2012-02-28 MED ORDER — HYDROCODONE-ACETAMINOPHEN 5-325 MG PO TABS
ORAL_TABLET | ORAL | Status: AC
  Filled 2012-02-28: qty 1

## 2012-02-28 MED ORDER — HYDROCODONE-ACETAMINOPHEN 5-325 MG PO TABS
1.0000 | ORAL_TABLET | Freq: Once | ORAL | Status: AC
  Administered 2012-02-28: 1 via ORAL

## 2012-02-28 NOTE — ED Notes (Signed)
Pt having severe nausea, vomiting and diarrhea for 2 days. The vomiting comes on suddenly when not eating. Pt reports a home temp of 101.8, denies any suspicious foods. Pt reports severe right side pain that travels from under his umbilicus around to his flank.

## 2012-02-28 NOTE — ED Notes (Signed)
Updated pt on care, blanket given.

## 2012-02-28 NOTE — ED Notes (Signed)
Pt complaining of abdominal pain above belly button radiating to right quadrant area since Wednesday. sts N,V,D. Hx of Chron's disease.

## 2012-02-28 NOTE — ED Provider Notes (Signed)
Lab and radiology results reviewed.  Discussed with Dr. Rubin Payor and with patient.  Likely gastroenteritis.  Currently tolerating po fluids.  Will prescribe anti-emetic for home use.  Progressive diet instructions provided.  Patient to follow-up with his PCP if symptoms do not continue to improve over the next several days.  Jimmye Norman, NP 02/28/12 [phone]

## 2012-02-28 NOTE — ED Provider Notes (Signed)
History     CSN: [id number]  Arrival date & time 02/28/12  1332   First MD Initiated Contact with Patient 02/28/12 1823      Chief Complaint  Patient presents with  . Abdominal Pain  . Emesis  . Diarrhea    (Consider location/radiation/quality/duration/timing/severity/associated sxs/prior treatment) Patient is a 26 y.o. male presenting with abdominal pain, vomiting, and diarrhea. The history is provided by the patient.  Abdominal Pain The primary symptoms of the illness include abdominal pain, fatigue, nausea, vomiting and diarrhea. The primary symptoms of the illness do not include shortness of breath.  Symptoms associated with the illness do not include back pain.  Emesis  Associated symptoms include abdominal pain and diarrhea. Pertinent negatives include no headaches.  Diarrhea The primary symptoms include fatigue, abdominal pain, nausea, vomiting and diarrhea. Primary symptoms do not include rash.  The illness does not include back pain.   patient with abdominal pain. Pain started yesterday. Said nausea vomiting diarrhea for 2-3 days. He had a reported temperature up to 101.8 her home. He has a history of Crohn's disease. He states he has some right-sided pain it goes from his belly button to his right abdomen. He is a somewhat decreased appetite. His been off his Asacol due to insurance reasons. No previous surgery for Crohn's disease. No dysuria. He still has his appendix. Seen in urgent care and transferred down here.  Past Medical History  Diagnosis Date  . Crohn's disease   . ADHD (attention deficit hyperactivity disorder)     History reviewed. No pertinent past surgical history.  History reviewed. No pertinent family history.  History  Substance Use Topics  . Smoking status: Never Smoker   . Smokeless tobacco: Not on file  . Alcohol Use: No      Review of Systems  Constitutional: Positive for appetite change and fatigue. Negative for activity change.  HENT:  Negative for neck stiffness.   Eyes: Negative for pain.  Respiratory: Negative for chest tightness and shortness of breath.   Cardiovascular: Negative for chest pain and leg swelling.  Gastrointestinal: Positive for nausea, vomiting, abdominal pain and diarrhea.  Genitourinary: Negative for flank pain.  Musculoskeletal: Negative for back pain.  Skin: Negative for rash.  Neurological: Negative for weakness, numbness and headaches.  Psychiatric/Behavioral: Negative for behavioral problems.    Allergies  Review of patient's allergies indicates no known allergies.  Home Medications   Current Outpatient Rx  Name Route Sig Dispense Refill  . ACETAMINOPHEN 325 MG PO TABS Oral Take 650 mg by mouth every 6 (six) hours as needed.      Marland Kitchen MESALAMINE 400 MG PO TBEC Oral Take 400 mg by mouth 3 (three) times daily.    Marland Kitchen OMEPRAZOLE 10 MG PO CPDR Oral Take 10 mg by mouth 2 (two) times daily.    Marland Kitchen RANITIDINE HCL 150 MG PO TABS Oral Take 150 mg by mouth 2 (two) times daily.    Marland Kitchen MESALAMINE 400 MG PO TBEC Oral Take 1 tablet (400 mg total) by mouth 3 (three) times daily. 90 tablet 11  . ONDANSETRON 8 MG PO TBDP Oral Take 1 tablet (8 mg total) by mouth every 8 (eight) hours as needed for nausea. 20 tablet 0    BP 124/78  Pulse 67  Temp 98 F (36.7 C)  Resp 20  SpO2 99%  Physical Exam  Nursing note and vitals reviewed. Constitutional: He is oriented to person, place, and time. He appears well-developed and well-nourished.  HENT:  Head: Normocephalic and atraumatic.  Eyes: EOM are normal. Pupils are equal, round, and reactive to light.  Neck: Normal range of motion. Neck supple.  Cardiovascular: Normal rate, regular rhythm and normal heart sounds.   No murmur heard. Pulmonary/Chest: Effort normal and breath sounds normal.  Abdominal: Soft. Bowel sounds are normal. He exhibits no distension and no mass. There is tenderness. There is no rebound and no guarding.       Some abdominal tenderness  without rebound or guarding. No masses palpated.  Musculoskeletal: Normal range of motion. He exhibits no edema.  Neurological: He is alert and oriented to person, place, and time. No cranial nerve deficit.  Skin: Skin is warm and dry.  Psychiatric: He has a normal mood and affect.    ED Course  Procedures (including critical care time)  Labs Reviewed  CBC WITH DIFFERENTIAL - Abnormal; Notable for the following:    WBC 13.8 (*)     Neutro Abs 10.1 (*)     All other components within normal limits  COMPREHENSIVE METABOLIC PANEL  LIPASE, BLOOD  LAB REPORT - SCANNED   Ct Abdomen Pelvis W Contrast  02/28/2012  *RADIOLOGY REPORT*  Clinical Data: Abdominal pain, history Crohn's disease.  Fever.  CT ABDOMEN AND PELVIS WITH CONTRAST  Technique:  Multidetector CT imaging of the abdomen and pelvis was performed following the standard protocol during bolus administration of intravenous contrast.  Contrast: 80mL OMNIPAQUE IOHEXOL 300 MG/ML  SOLN  Comparison: None.  Findings: Lung bases are clear.  No pericardial fluid.  No focal hepatic lesion.  The gallbladder, pancreas, spleen, adrenal glands, and kidneys are normal.  The stomach, small bowel, and cecum are normal.  The terminal ileum appears normal.  No evidence of inflammation, fistula, or abscess at the distal small bowel.  The appendix is normal.  The colon and rectosigmoid colon are normal.  There is a segment of collapse of the sigmoid colon which is felt to be physiologic rather than inflammatory.  Abdominal aorta normal caliber.  No retroperitoneal periportal lymphadenopathy.  No free fluid the pelvis.  The bladder and prostate gland are normal.  No pelvic lymphadenopathy.  IMPRESSION:  1.  No evidence of bowel inflammation to suggest active Crohn's disease.  No fistula or abscess.  2.   Normal appendix.   Original Report Authenticated By: Genevive Bi, M.D.      1. Nausea vomiting and diarrhea       MDM  Patient with abdominal pain.  Nausea vomiting diarrhea. White count mildly elevated. History Crohn's disease. CT abdomen and pelvis done showed no clear Crohn's disease or appendicitis. He is tolerated orals will be discharged home        Juliet Rude. Rubin Payor, MD 02/29/12 2249

## 2012-02-28 NOTE — ED Provider Notes (Signed)
History     CSN: [id number]  Arrival date & time 02/28/12  1134   First MD Initiated Contact with Patient 02/28/12 1201      Chief Complaint  Patient presents with  . Emesis    (Consider location/radiation/quality/duration/timing/severity/associated sxs/prior treatment) HPI Comments: 26 year old male with history of Crohn's disease. Has been off mesalamine for over 2 months as he cannot afford this medication and does not longer have medical insurance. Here complaining of nausea vomiting, diarrhea and abdominal pain for 3 days. States that he had temperature above 101 yesterday evening. Unable to keep fluids down since last night. More than 15 episodes of non bloody diarrhea since last night. Denies melena or red blood from rectum.   Past Medical History  Diagnosis Date  . Crohn's disease   . ADHD (attention deficit hyperactivity disorder)     History reviewed. No pertinent past surgical history.  History reviewed. No pertinent family history.  History  Substance Use Topics  . Smoking status: Never Smoker   . Smokeless tobacco: Not on file  . Alcohol Use: No      Review of Systems  Constitutional: Positive for fever, chills and appetite change.  HENT: Negative for sore throat and mouth sores.   Respiratory: Negative for cough and shortness of breath.   Cardiovascular: Negative for chest pain.  Gastrointestinal: Positive for nausea, vomiting, abdominal pain and diarrhea. Negative for blood in stool, abdominal distention, anal bleeding and rectal pain.  Genitourinary: Negative for dysuria.  Musculoskeletal: Negative for joint swelling and arthralgias.  Skin: Negative for rash.  Neurological: Negative for dizziness and headaches.    Allergies  Review of patient's allergies indicates no known allergies.  Home Medications   Current Outpatient Rx  Name Route Sig Dispense Refill  . OMEPRAZOLE 10 MG PO CPDR Oral Take 10 mg by mouth 2 (two) times daily.    .  ACETAMINOPHEN 325 MG PO TABS Oral Take 650 mg by mouth every 6 (six) hours as needed.      Marland Kitchen MESALAMINE 400 MG PO TBEC Oral Take 1 tablet (400 mg total) by mouth 3 (three) times daily. 90 tablet 11  . METHYLPHENIDATE HCL 20 MG PO TABS Oral Take 1 tablet (20 mg total) by mouth 2 (two) times daily. 60 tablet 0  . RANITIDINE HCL 150 MG PO TABS Oral Take 1 tablet (150 mg total) by mouth 2 (two) times daily. 60 tablet 1  . TOPIRAMATE 100 MG PO TABS Oral Take 1.5 tablets (150 mg total) by mouth daily. 45 tablet 11    BP 124/91  Pulse 78  Temp 98 F (36.7 C) (Oral)  Resp 16  SpO2 98%  Physical Exam  Nursing note and vitals reviewed. Constitutional: He is oriented to person, place, and time. He appears well-developed and well-nourished.       Sitting in bed. Dry heaving.  HENT:  Head: Normocephalic and atraumatic.  Mouth/Throat: No oropharyngeal exudate.       Face appears flushed. Dry lips. Pharyngeal erythema. No aphthous ulcers. No exudates.  Eyes: Conjunctivae normal are normal. Pupils are equal, round, and reactive to light. No scleral icterus.  Neck: Neck supple. No thyromegaly present.  Cardiovascular: Normal rate, regular rhythm and normal heart sounds.  Exam reveals no friction rub.   No murmur heard. Pulmonary/Chest: Effort normal and breath sounds normal. No respiratory distress. He has no wheezes. He has no rales.  Abdominal: Soft. He exhibits no mass.       No distention,  tenderness to superficial and palpation in periumbilical area, worse reported with some guarding in right flank and right lower quadrant. No rebound. Impress increased bowel sounds on the right side.  Genitourinary:       Rectal exam was not performed  Musculoskeletal:       No joint swelling  Lymphadenopathy:    He has no cervical adenopathy.  Neurological: He is alert and oriented to person, place, and time.  Skin: No rash noted.    ED Course  Procedures (including critical care time)  Labs Reviewed  - No data to display No results found.   1. Crohn's colitis   2. Abdominal pain, acute       MDM  26 year old male with history of Crohn's disease. Here complaining of nausea vomiting, diarrhea and abdominal pain for 3 days. On exam: afebrile, Vital signs stable blood pressure 120s/70's, not tachycardic. No distention, Diffuse abdominal tenderness to palpation with guarding in right flank and right lower quadrant. No rebound. Impress increased normal active bowel sounds also on the right side. Likely Crohn's flare up decided to transfer to the emergency department for further evaluation and management. Patient vital signs are stable and he was transferred via shuttle. Had ondansetron 8 mg administered sublingual x1 and Norco 325/5 mg oral one tablet x1. Prior to transfer to the emergency department.         Sharin Grave, MD 03/01/12 [phone]

## 2012-02-29 NOTE — ED Provider Notes (Signed)
Medical screening examination/treatment/procedure(s) were performed by non-physician practitioner and as supervising physician I was immediately available for consultation/collaboration.  Rozalia Dino R. Maurissa Ambrose, MD 02/29/12 2256 

## 2012-06-04 ENCOUNTER — Encounter (HOSPITAL_COMMUNITY): Payer: Self-pay | Admitting: Cardiology

## 2012-06-04 ENCOUNTER — Emergency Department (HOSPITAL_COMMUNITY): Payer: Managed Care, Other (non HMO)

## 2012-06-04 ENCOUNTER — Emergency Department (HOSPITAL_COMMUNITY)
Admission: EM | Admit: 2012-06-04 | Discharge: 2012-06-04 | Disposition: A | Discharge status: Home / Self Care | Payer: Managed Care, Other (non HMO) | Attending: Emergency Medicine | Admitting: Emergency Medicine

## 2012-06-04 DIAGNOSIS — F909 Attention-deficit hyperactivity disorder, unspecified type: Secondary | ICD-10-CM | POA: Insufficient documentation

## 2012-06-04 DIAGNOSIS — Y92009 Unspecified place in unspecified non-institutional (private) residence as the place of occurrence of the external cause: Secondary | ICD-10-CM | POA: Insufficient documentation

## 2012-06-04 DIAGNOSIS — R251 Tremor, unspecified: Secondary | ICD-10-CM

## 2012-06-04 DIAGNOSIS — H538 Other visual disturbances: Secondary | ICD-10-CM | POA: Insufficient documentation

## 2012-06-04 DIAGNOSIS — W108XXA Fall (on) (from) other stairs and steps, initial encounter: Secondary | ICD-10-CM | POA: Insufficient documentation

## 2012-06-04 DIAGNOSIS — Z87820 Personal history of traumatic brain injury: Secondary | ICD-10-CM | POA: Insufficient documentation

## 2012-06-04 DIAGNOSIS — S92309A Fracture of unspecified metatarsal bone(s), unspecified foot, initial encounter for closed fracture: Secondary | ICD-10-CM | POA: Insufficient documentation

## 2012-06-04 DIAGNOSIS — R259 Unspecified abnormal involuntary movements: Secondary | ICD-10-CM | POA: Insufficient documentation

## 2012-06-04 DIAGNOSIS — K5090 Crohn's disease, unspecified, without complications: Secondary | ICD-10-CM | POA: Insufficient documentation

## 2012-06-04 DIAGNOSIS — Y939 Activity, unspecified: Secondary | ICD-10-CM | POA: Insufficient documentation

## 2012-06-04 LAB — BASIC METABOLIC PANEL
CO2: 30 mEq/L (ref 19–32)
Calcium: 9.4 mg/dL (ref 8.4–10.5)
Chloride: 102 mEq/L (ref 96–112)
Creatinine, Ser: 0.86 mg/dL (ref 0.50–1.35)
Glucose, Bld: 92 mg/dL (ref 70–99)

## 2012-06-04 LAB — GLUCOSE, CAPILLARY: Glucose-Capillary: 99 mg/dL (ref 70–99)

## 2012-06-04 MED ORDER — IBUPROFEN 400 MG PO TABS
800.0000 mg | ORAL_TABLET | Freq: Once | ORAL | Status: AC
  Administered 2012-06-04: 800 mg via ORAL
  Filled 2012-06-04: qty 2

## 2012-06-04 NOTE — ED Notes (Signed)
Pt reports tremors for the past 2 weeks. States he is unsure of what could be causing the tremors. Denies any weakness or dizziness. States he feels off balance. States he fell down the stairs on Saturday and hurt his left foot.

## 2012-06-04 NOTE — Progress Notes (Signed)
Orthopedic Tech Progress Note Patient Details:  Justin [name] [birth year] [id number]  Ortho Devices Type of Ortho Device: Ace wrap;Watson Jones splint Ortho Device/Splint Location: (R) LE Ortho Device/Splint Interventions: Application   Jennye Moccasin 06/04/2012, 7:04 PM

## 2012-06-04 NOTE — ED Notes (Signed)
Pt reports intermittent shaking to right arm and occasionally right leg x 3 days. States its getting progressively worse. Denies anything makes it worse/better, taking new rx. Visibly shaking right arm. Pt reports feeling off balance due to shaking resulting in falling down stairs Saturday.
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
## 2012-06-05 NOTE — ED Provider Notes (Signed)
Medical screening examination/treatment/procedure(s) were performed by non-physician practitioner and as supervising physician I was immediately available for consultation/collaboration.   Duy Lemming, MD 06/05/12 0056 

## 2012-08-14 ENCOUNTER — Encounter (HOSPITAL_COMMUNITY): Payer: Self-pay | Admitting: Emergency Medicine

## 2012-08-14 ENCOUNTER — Emergency Department (HOSPITAL_COMMUNITY)
Admission: EM | Admit: 2012-08-14 | Discharge: 2012-08-14 | Disposition: A | Discharge status: Home / Self Care | Payer: Managed Care, Other (non HMO) | Attending: Emergency Medicine | Admitting: Emergency Medicine

## 2012-08-14 ENCOUNTER — Emergency Department (HOSPITAL_COMMUNITY): Payer: Managed Care, Other (non HMO)

## 2012-08-14 DIAGNOSIS — S8990XA Unspecified injury of unspecified lower leg, initial encounter: Secondary | ICD-10-CM | POA: Diagnosis present

## 2012-08-14 DIAGNOSIS — Z8719 Personal history of other diseases of the digestive system: Secondary | ICD-10-CM | POA: Diagnosis not present

## 2012-08-14 DIAGNOSIS — F17200 Nicotine dependence, unspecified, uncomplicated: Secondary | ICD-10-CM | POA: Insufficient documentation

## 2012-08-14 DIAGNOSIS — Z8781 Personal history of (healed) traumatic fracture: Secondary | ICD-10-CM | POA: Diagnosis not present

## 2012-08-14 DIAGNOSIS — W2203XA Walked into furniture, initial encounter: Secondary | ICD-10-CM | POA: Insufficient documentation

## 2012-08-14 DIAGNOSIS — M79672 Pain in left foot: Secondary | ICD-10-CM

## 2012-08-14 DIAGNOSIS — Y939 Activity, unspecified: Secondary | ICD-10-CM | POA: Diagnosis not present

## 2012-08-14 DIAGNOSIS — Z8659 Personal history of other mental and behavioral disorders: Secondary | ICD-10-CM | POA: Insufficient documentation

## 2012-08-14 DIAGNOSIS — Y9229 Other specified public building as the place of occurrence of the external cause: Secondary | ICD-10-CM | POA: Insufficient documentation

## 2012-08-14 DIAGNOSIS — S99919A Unspecified injury of unspecified ankle, initial encounter: Secondary | ICD-10-CM | POA: Diagnosis present

## 2012-08-14 HISTORY — DX: Unspecified fracture of right foot, initial encounter for closed fracture: S92.901A

## 2012-08-14 NOTE — ED Notes (Signed)
Patient transported to X-ray 

## 2012-08-14 NOTE — ED Provider Notes (Signed)
Medical screening examination/treatment/procedure(s) were performed by non-physician practitioner and as supervising physician I was immediately available for consultation/collaboration.  Flint Melter, MD 08/14/12 2204
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
## 2012-08-14 NOTE — ED Notes (Signed)
AIDET performed. 

## 2012-08-14 NOTE — ED Provider Notes (Signed)
History     CSN: [id number]  Arrival date & time 08/14/12  0740   First MD Initiated Contact with Patient 08/14/12 0757      Chief Complaint  Patient presents with  . Foot Pain    (Consider location/radiation/quality/duration/timing/severity/associated sxs/prior treatment) HPI Comments: Patient presents with a chief complaint of pain of the dorsal aspect of the left foot over the 1st-5th metacarpal.  He reports that he developed pain last evening after a stool fell on his feet.  He has been able to ambulate, but reports increased pain with ambulation.  He has tried icing the foot and has taken Ibuprofen, which has helped somewhat.  He denies numbness or tingling.  He has not noticed any bruising.  He reports that he did have mild swelling initially, but swelling has since resolved.  Patient reports that he has crutches at home.    The history is provided by the patient.    Past Medical History  Diagnosis Date  . Crohn's disease   . ADHD (attention deficit hyperactivity disorder)   . Foot fracture, right     History reviewed. No pertinent past surgical history.  No family history on file.  History  Substance Use Topics  . Smoking status: Current Every Day Smoker -- 0.50 packs/day    Types: Cigarettes  . Smokeless tobacco: Not on file  . Alcohol Use: Yes      Review of Systems  Musculoskeletal:       Left foot pain  Neurological: Negative for numbness.    Allergies  Review of patient's allergies indicates no known allergies.  Home Medications  No current outpatient prescriptions on file.  BP 145/84  Pulse 120  Temp(Src) 97 F (36.1 C) (Oral)  SpO2 97%  Physical Exam  Nursing note and vitals reviewed. Constitutional: He appears well-developed and well-nourished. No distress.  HENT:  Head: Normocephalic and atraumatic.  Neck: Normal range of motion. Neck supple.  Cardiovascular: Normal rate, regular rhythm, normal heart sounds and intact distal pulses.    Pulses:      Dorsalis pedis pulses are 2+ on the right side, and 2+ on the left side.  Pulmonary/Chest: Effort normal and breath sounds normal.  Musculoskeletal: Normal range of motion.  Full ROM of all toes on the left foot and left ankle.  No swelling of the left foot noted on exam  Neurological: He is alert. No sensory deficit.  Sensation of all toes on the left foot  Skin: Skin is warm and dry. No bruising and no ecchymosis noted. He is not diaphoretic. No erythema.  Psychiatric: He has a normal mood and affect.    ED Course  Procedures (including critical care time)  Labs Reviewed - No data to display Dg Foot Complete Left  08/14/2012  *RADIOLOGY REPORT*  Clinical Data: Pain across metatarsals  LEFT FOOT - COMPLETE 3+ VIEW  Comparison: 09/27/2009  Findings: No fracture or dislocation is seen.  The joint spaces are preserved.  The visualized soft tissues are unremarkable.  IMPRESSION: No fracture or dislocation is seen.   Original Report Authenticated By: Charline Bills, M.D.      No diagnosis found.    MDM  Patient presenting with left foot pain.  Xray negative.  Patient neurovascularly intact.  He reports that he has crutches at home.        Pascal Lux Schuyler, PA-C 08/14/12 1424

## 2012-09-21 ENCOUNTER — Encounter: Payer: Self-pay | Admitting: *Deleted

## 2013-01-08 ENCOUNTER — Inpatient Hospital Stay (HOSPITAL_COMMUNITY)
Admission: EM | Admit: 2013-01-08 | Discharge: 2013-01-11 | DRG: 897 | Disposition: A | Discharge status: Home / Self Care | Payer: No Typology Code available for payment source | Source: Intra-hospital | Attending: Psychiatry | Admitting: Psychiatry

## 2013-01-08 ENCOUNTER — Encounter (HOSPITAL_COMMUNITY): Payer: Self-pay

## 2013-01-08 ENCOUNTER — Encounter (HOSPITAL_COMMUNITY): Payer: Self-pay | Admitting: *Deleted

## 2013-01-08 ENCOUNTER — Emergency Department (HOSPITAL_COMMUNITY)
Admission: EM | Admit: 2013-01-08 | Discharge: 2013-01-08 | Disposition: A | Discharge status: Other Acute Inpatient Hospital | Payer: No Typology Code available for payment source | Attending: Emergency Medicine | Admitting: Emergency Medicine

## 2013-01-08 DIAGNOSIS — Z8719 Personal history of other diseases of the digestive system: Secondary | ICD-10-CM | POA: Insufficient documentation

## 2013-01-08 DIAGNOSIS — F1210 Cannabis abuse, uncomplicated: Secondary | ICD-10-CM | POA: Insufficient documentation

## 2013-01-08 DIAGNOSIS — F17200 Nicotine dependence, unspecified, uncomplicated: Secondary | ICD-10-CM | POA: Insufficient documentation

## 2013-01-08 DIAGNOSIS — K5090 Crohn's disease, unspecified, without complications: Secondary | ICD-10-CM | POA: Diagnosis present

## 2013-01-08 DIAGNOSIS — F909 Attention-deficit hyperactivity disorder, unspecified type: Secondary | ICD-10-CM | POA: Diagnosis present

## 2013-01-08 DIAGNOSIS — F1910 Other psychoactive substance abuse, uncomplicated: Secondary | ICD-10-CM | POA: Diagnosis present

## 2013-01-08 DIAGNOSIS — F1920 Other psychoactive substance dependence, uncomplicated: Secondary | ICD-10-CM | POA: Diagnosis present

## 2013-01-08 DIAGNOSIS — F1010 Alcohol abuse, uncomplicated: Secondary | ICD-10-CM | POA: Insufficient documentation

## 2013-01-08 DIAGNOSIS — F1020 Alcohol dependence, uncomplicated: Principal | ICD-10-CM | POA: Diagnosis present

## 2013-01-08 DIAGNOSIS — F1290 Cannabis use, unspecified, uncomplicated: Secondary | ICD-10-CM

## 2013-01-08 DIAGNOSIS — Z8781 Personal history of (healed) traumatic fracture: Secondary | ICD-10-CM | POA: Insufficient documentation

## 2013-01-08 DIAGNOSIS — Z8659 Personal history of other mental and behavioral disorders: Secondary | ICD-10-CM | POA: Insufficient documentation

## 2013-01-08 DIAGNOSIS — Z79899 Other long term (current) drug therapy: Secondary | ICD-10-CM

## 2013-01-08 DIAGNOSIS — F1410 Cocaine abuse, uncomplicated: Secondary | ICD-10-CM | POA: Insufficient documentation

## 2013-01-08 DIAGNOSIS — F4310 Post-traumatic stress disorder, unspecified: Secondary | ICD-10-CM | POA: Diagnosis present

## 2013-01-08 LAB — COMPREHENSIVE METABOLIC PANEL
ALT: 52 U/L (ref 0–53)
Alkaline Phosphatase: 61 U/L (ref 39–117)
BUN: 6 mg/dL (ref 6–23)
CO2: 25 mEq/L (ref 19–32)
GFR calc Af Amer: >90 mL/min (ref >90)
GFR calc non Af Amer: >90 mL/min (ref >90)
Glucose, Bld: 97 mg/dL (ref 70–99)
Potassium: 3.8 mEq/L (ref 3.5–5.1)
Sodium: 134 mEq/L — ABNORMAL LOW (ref 135–145)

## 2013-01-08 LAB — CBC
HCT: 52.7 % — ABNORMAL HIGH (ref 39.0–52.0)
Hemoglobin: 19.3 g/dL — ABNORMAL HIGH (ref 13.0–17.0)
MCH: 36.5 pg — ABNORMAL HIGH (ref 26.0–34.0)
MCHC: 36.6 g/dL — ABNORMAL HIGH (ref 30.0–36.0)
RBC: 5.29 MIL/uL (ref 4.22–5.81)

## 2013-01-08 LAB — ETHANOL: Alcohol, Ethyl (B): 190 mg/dL — ABNORMAL HIGH (ref 0–11)

## 2013-01-08 LAB — RAPID URINE DRUG SCREEN, HOSP PERFORMED
Barbiturates: NOT DETECTED
Tetrahydrocannabinol: NOT DETECTED

## 2013-01-08 MED ORDER — LORAZEPAM 1 MG PO TABS: 0.0000 mg | ORAL_TABLET | Freq: Two times a day (BID) | ORAL | Status: DC

## 2013-01-08 MED ORDER — TRAZODONE HCL 50 MG PO TABS
50.0000 mg | ORAL_TABLET | Freq: Every day | ORAL | Status: DC
  Administered 2013-01-08 – 2013-01-10 (×4): 50 mg via ORAL
  Filled 2013-01-08 (×6): qty 1

## 2013-01-08 MED ORDER — LOPERAMIDE HCL 2 MG PO CAPS: 2.0000 mg | ORAL_CAPSULE | ORAL | Status: DC | PRN

## 2013-01-08 MED ORDER — VITAMIN B-1 100 MG PO TABS
100.0000 mg | ORAL_TABLET | Freq: Once | ORAL | Status: AC
  Administered 2013-01-08: 100 mg via ORAL
  Filled 2013-01-08: qty 1

## 2013-01-08 MED ORDER — CHLORDIAZEPOXIDE HCL 25 MG PO CAPS
25.0000 mg | ORAL_CAPSULE | Freq: Three times a day (TID) | ORAL | Status: AC
  Administered 2013-01-10: 25 mg via ORAL
  Filled 2013-01-08 (×3): qty 1

## 2013-01-08 MED ORDER — MAGNESIUM HYDROXIDE 400 MG/5ML PO SUSP: 30.0000 mL | Freq: Every day | ORAL | Status: DC | PRN

## 2013-01-08 MED ORDER — LORAZEPAM 1 MG PO TABS: 1.0000 mg | ORAL_TABLET | Freq: Three times a day (TID) | ORAL | Status: DC | PRN

## 2013-01-08 MED ORDER — NICOTINE 21 MG/24HR TD PT24
21.0000 mg | MEDICATED_PATCH | Freq: Every day | TRANSDERMAL | Status: DC
  Administered 2013-01-08: 21 mg via TRANSDERMAL

## 2013-01-08 MED ORDER — VITAMIN B-1 100 MG PO TABS
100.0000 mg | ORAL_TABLET | Freq: Every day | ORAL | Status: DC
  Administered 2013-01-09 – 2013-01-11 (×3): 100 mg via ORAL
  Filled 2013-01-08 (×5): qty 1

## 2013-01-08 MED ORDER — CHLORDIAZEPOXIDE HCL 25 MG PO CAPS
25.0000 mg | ORAL_CAPSULE | Freq: Once | ORAL | Status: AC
  Administered 2013-01-08: 25 mg via ORAL
  Filled 2013-01-08: qty 1

## 2013-01-08 MED ORDER — CHLORDIAZEPOXIDE HCL 25 MG PO CAPS
25.0000 mg | ORAL_CAPSULE | Freq: Four times a day (QID) | ORAL | Status: DC | PRN
  Administered 2013-01-10: 25 mg via ORAL
  Filled 2013-01-08: qty 1

## 2013-01-08 MED ORDER — ZOLPIDEM TARTRATE 5 MG PO TABS: 5.0000 mg | ORAL_TABLET | Freq: Every evening | ORAL | Status: DC | PRN

## 2013-01-08 MED ORDER — VITAMIN B-1 100 MG PO TABS: 100.0000 mg | ORAL_TABLET | Freq: Every day | ORAL | Status: DC

## 2013-01-08 MED ORDER — ONDANSETRON 4 MG PO TBDP: 4.0000 mg | ORAL_TABLET | Freq: Four times a day (QID) | ORAL | Status: DC | PRN

## 2013-01-08 MED ORDER — CHLORDIAZEPOXIDE HCL 25 MG PO CAPS
25.0000 mg | ORAL_CAPSULE | Freq: Four times a day (QID) | ORAL | Status: AC
  Administered 2013-01-08 – 2013-01-10 (×6): 25 mg via ORAL
  Filled 2013-01-08 (×6): qty 1

## 2013-01-08 MED ORDER — MESALAMINE 400 MG PO CPDR
400.0000 mg | DELAYED_RELEASE_CAPSULE | Freq: Every day | ORAL | Status: DC
  Administered 2013-01-08 – 2013-01-11 (×4): 400 mg via ORAL
  Filled 2013-01-08 (×7): qty 1

## 2013-01-08 MED ORDER — NICOTINE 14 MG/24HR TD PT24
14.0000 mg | MEDICATED_PATCH | Freq: Every day | TRANSDERMAL | Status: DC
  Administered 2013-01-09 – 2013-01-11 (×3): 14 mg via TRANSDERMAL
  Filled 2013-01-08 (×5): qty 1

## 2013-01-08 MED ORDER — ALUM & MAG HYDROXIDE-SIMETH 200-200-20 MG/5ML PO SUSP: 30.0000 mL | ORAL | Status: DC | PRN

## 2013-01-08 MED ORDER — ACETAMINOPHEN 325 MG PO TABS: 650.0000 mg | ORAL_TABLET | Freq: Four times a day (QID) | ORAL | Status: DC | PRN

## 2013-01-08 MED ORDER — VITAMIN B-1 100 MG PO TABS
100.0000 mg | ORAL_TABLET | Freq: Every day | ORAL | Status: DC
  Administered 2013-01-08: 100 mg via ORAL
  Filled 2013-01-08: qty 1

## 2013-01-08 MED ORDER — CHLORDIAZEPOXIDE HCL 25 MG PO CAPS: 25.0000 mg | ORAL_CAPSULE | Freq: Four times a day (QID) | ORAL | Status: DC | PRN

## 2013-01-08 MED ORDER — LORAZEPAM 1 MG PO TABS: 0.0000 mg | ORAL_TABLET | Freq: Four times a day (QID) | ORAL | Status: DC

## 2013-01-08 MED ORDER — CHLORDIAZEPOXIDE HCL 25 MG PO CAPS: 25.0000 mg | ORAL_CAPSULE | Freq: Every day | ORAL | Status: DC

## 2013-01-08 MED ORDER — CHLORDIAZEPOXIDE HCL 25 MG PO CAPS
25.0000 mg | ORAL_CAPSULE | ORAL | Status: DC
  Filled 2013-01-08 (×2): qty 2

## 2013-01-08 MED ORDER — ONDANSETRON HCL 4 MG PO TABS: 4.0000 mg | ORAL_TABLET | Freq: Three times a day (TID) | ORAL | Status: DC | PRN

## 2013-01-08 MED ORDER — THIAMINE HCL 100 MG/ML IJ SOLN: 100.0000 mg | Freq: Once | INTRAMUSCULAR | Status: DC

## 2013-01-08 MED ORDER — LORAZEPAM 2 MG/ML IJ SOLN: 0.0000 mg | Freq: Two times a day (BID) | INTRAMUSCULAR | Status: DC

## 2013-01-08 MED ORDER — ACETAMINOPHEN 325 MG PO TABS: 650.0000 mg | ORAL_TABLET | ORAL | Status: DC | PRN

## 2013-01-08 MED ORDER — ADULT MULTIVITAMIN W/MINERALS CH
1.0000 | ORAL_TABLET | Freq: Every day | ORAL | Status: DC
  Administered 2013-01-08 – 2013-01-11 (×4): 1 via ORAL
  Filled 2013-01-08 (×7): qty 1

## 2013-01-08 MED ORDER — ADULT MULTIVITAMIN W/MINERALS CH
1.0000 | ORAL_TABLET | Freq: Every day | ORAL | Status: DC
  Administered 2013-01-08: 1 via ORAL
  Filled 2013-01-08: qty 1

## 2013-01-08 MED ORDER — IBUPROFEN 200 MG PO TABS: 600.0000 mg | ORAL_TABLET | Freq: Three times a day (TID) | ORAL | Status: DC | PRN

## 2013-01-08 MED ORDER — LORAZEPAM 2 MG/ML IJ SOLN: 0.0000 mg | Freq: Four times a day (QID) | INTRAMUSCULAR | Status: DC

## 2013-01-08 MED ORDER — HYDROXYZINE HCL 25 MG PO TABS: 25.0000 mg | ORAL_TABLET | Freq: Four times a day (QID) | ORAL | Status: DC | PRN

## 2013-01-08 MED ORDER — THIAMINE HCL 100 MG/ML IJ SOLN: 100.0000 mg | Freq: Every day | INTRAMUSCULAR | Status: DC

## 2013-01-08 MED ORDER — HYDROXYZINE HCL 25 MG PO TABS
25.0000 mg | ORAL_TABLET | Freq: Four times a day (QID) | ORAL | Status: DC | PRN
  Administered 2013-01-09: 25 mg via ORAL

## 2013-01-08 NOTE — ED Notes (Signed)
When patient came in he said he just wanted to sleep.rn is aware i didn't get vitals at this time will let am staff aware when patient wake up can they get vitals

## 2013-01-08 NOTE — Consult Note (Signed)
Patient seen, evaluated and recommendations made by me 

## 2013-01-08 NOTE — ED Provider Notes (Signed)
CSN: [id number]     Arrival date & time 01/08/13  0307 History     First MD Initiated Contact with Patient 01/08/13 0406     Chief Complaint  Patient presents with  . Medical Clearance   (Consider location/radiation/quality/duration/timing/severity/associated sxs/prior Treatment) HPI Pt is a 27yo male requesting detox from cocaine, marijuana and ETOH.  Pt states he has been drinking every day for the past 3 years, usually 12-24 pack of beer, and "whatever liquor I can get my hands on." Pt also smokes marijuana every day. Pt states he does do cocaine but "recreational.  Admits to drinking when he wakes up every day but states he doesn't feel like he "needs" alcohol to function.  Denies ever trying to detox from alcohol or drugs in the past.  Reports 2 previous seizures, last one 16mo ago but unsure if that was due to alcohol.  States his dad had epilepsy so thinks it could be from that.  Pt says he came in today due to pressure and support from friends and family.  PMH significant for Crohn's disease and ADHD.  Denies hx of anxiety, depression, bipolar disorder or schizophrenia.  Denies SI/HI   Past Medical History  Diagnosis Date  . Crohn's disease   . ADHD (attention deficit hyperactivity disorder)   . Foot fracture, right    History reviewed. No pertinent past surgical history. No family history on file. History  Substance Use Topics  . Smoking status: Current Every Day Smoker -- 1.00 packs/day    Types: Cigarettes  . Smokeless tobacco: Not on file  . Alcohol Use: 120.0 oz/week    200 Cans of beer per week     Comment: 15-30 beers daily    Review of Systems  Constitutional: Negative for fever and diaphoresis.  Gastrointestinal: Negative for nausea, vomiting, abdominal pain and diarrhea.  Neurological: Negative for tremors, seizures and syncope.  All other systems reviewed and are negative.    Allergies  Review of patient's allergies indicates no known allergies.  Home  Medications   No current outpatient prescriptions on file. BP 126/81  Pulse 86  Temp(Src) 98.2 F (36.8 C) (Oral)  Resp 17  Ht 6\' 2"  (1.88 m)  Wt 178 lb (80.74 kg)  BMI 22.84 kg/m2  SpO2 96% Physical Exam  Nursing note and vitals reviewed. Constitutional: He appears well-developed and well-nourished.  HENT:  Head: Normocephalic and atraumatic.  Eyes: Conjunctivae are normal. No scleral icterus.  Neck: Normal range of motion.  Cardiovascular: Normal rate, regular rhythm and normal heart sounds.   Pulmonary/Chest: Effort normal and breath sounds normal. No respiratory distress. He has no wheezes. He has no rales. He exhibits no tenderness.  Abdominal: Soft. Bowel sounds are normal. He exhibits no distension and no mass. There is no tenderness. There is no rebound and no guarding.  Musculoskeletal: Normal range of motion.  Neurological: He is alert.  Skin: Skin is warm and dry.  Psychiatric: He has a normal mood and affect. His speech is normal and behavior is normal. He expresses no homicidal and no suicidal ideation.    ED Course   Procedures (including critical care time)  Labs Reviewed  CBC - Abnormal; Notable for the following:    WBC 15.3 (*)    Hemoglobin 19.3 (*)    HCT 52.7 (*)    MCH 36.5 (*)    MCHC 36.6 (*)    All other components within normal limits  COMPREHENSIVE METABOLIC PANEL - Abnormal; Notable for the  following:    Sodium 134 (*)    All other components within normal limits  ETHANOL - Abnormal; Notable for the following:    Alcohol, Ethyl (B) 190 (*)    All other components within normal limits  SALICYLATE LEVEL - Abnormal; Notable for the following:    Salicylate Lvl <2.0 (*)    All other components within normal limits  ACETAMINOPHEN LEVEL  URINE RAPID DRUG SCREEN (HOSP PERFORMED)   No results found. 1. Alcohol abuse   2. Cocaine abuse   3. Marijuana use     MDM  Pt requesting detox from alcohol.  Has not tried to stop drinking alcohol in  past.  Unknown hx of seizures from alcohol withdrawal.  Discussed pt with Dr. Norlene Campbell who agreed to send pt to behavioral health for alcohol detox.  Pt does have elevated WBC-likely due to hx of Crohn's disease. Psych hold, CIWA orders, and consult to ACT placed.    Junius Finner, PA-C 01/08/13 2029

## 2013-01-08 NOTE — ED Notes (Signed)
Pt has been wanded by security. 

## 2013-01-08 NOTE — Consult Note (Signed)
[facility] Psychiatry Consult   Reason for Consult:  Substance dependence Referring Physician:  [name] is an 27 y.o. male.  Assessment: AXIS I:  Substance Abuse AXIS II:  Deferred AXIS III:   Past Medical History  Diagnosis Date  . Crohn's disease   . ADHD (attention deficit hyperactivity disorder)   . Foot fracture, right    AXIS IV:  other psychosocial or environmental problems and problems related to social environment AXIS V:  51-60 moderate symptoms  Plan:  Recommend psychiatric Inpatient admission when medically cleared.  Subjective:   Justin [name] is a 27 y.o. male patient admitted with polysubstance dependence; alcohol, Marijuana and Cocaine dependence.  Patient want detox and plans to continue treatment at a long term rehabilitation facility.   HPI:  Patient brought self in early today seeking detox treatment from alcohol, cocaine and marijuana.  Patient states he consumes 15-30 16 oz beer daily and his last drink was 1 am this morning.  Patient states he also uses cocaine and marijuana and was able to state how much he uses.  His last use of cocaine was 3 days ago.  He reports drinking heavily since November.  He denies detoxification treatment from any facility and he denies any psychiatric diagnosis.  His alcohol level on arrival was 190 while his Urine drug screen was negative.  He denies SI/HI/AVH and he does not exhibit paranoia. HPI Elements:   Location:  [facility]. Quality:  Moderate. Severity:  urgent. Timing:  now. Duration:  3 years. Context:  detox.  Past Psychiatric History: Past Medical History  Diagnosis Date  . Crohn's disease   . ADHD (attention deficit hyperactivity disorder)   . Foot fracture, right     reports that he has been smoking Cigarettes.  He has been smoking about 1.00 pack per day. He does not have any smokeless tobacco history on file. He reports that  drinks alcohol. He reports that he uses illicit drugs (Cocaine and  Marijuana). No family history on file.         Allergies:  No Known Allergies  Past Psychiatric History: Diagnosis:  Substance dependence  Hospitalizations:  none  Outpatient Care:  none  Substance Abuse Care:  none  Self-Mutilation:  none  Suicidal Attempts:  none  Violent Behaviors:  none   Objective: Blood pressure 120/92, pulse 97, temperature 98.3 F (36.8 C), temperature source Oral, resp. rate 16, height 6\' 2"  (1.88 m), weight 80.74 kg (178 lb), SpO2 96.00%.Body mass index is 22.84 kg/(m^2). Results for orders placed during the hospital encounter of 01/08/13 (from the past 72 hour(s))  URINE RAPID DRUG SCREEN (HOSP PERFORMED)     Status: None   Collection Time    01/08/13  3:18 AM      Result Value Range   Opiates NONE DETECTED  NONE DETECTED   Cocaine NONE DETECTED  NONE DETECTED   Benzodiazepines NONE DETECTED  NONE DETECTED   Amphetamines NONE DETECTED  NONE DETECTED   Tetrahydrocannabinol NONE DETECTED  NONE DETECTED   Barbiturates NONE DETECTED  NONE DETECTED   Comment:            DRUG SCREEN FOR MEDICAL PURPOSES     ONLY.  IF CONFIRMATION IS NEEDED     FOR ANY PURPOSE, NOTIFY LAB     WITHIN 5 DAYS.                LOWEST DETECTABLE LIMITS     FOR URINE DRUG  SCREEN     Drug Class       Cutoff (ng/mL)     Amphetamine      1000     Barbiturate      200     Benzodiazepine   200     Tricyclics       300     Opiates          300     Cocaine          300     THC              50  ACETAMINOPHEN LEVEL     Status: None   Collection Time    01/08/13  3:36 AM      Result Value Range   Acetaminophen (Tylenol), Serum <15.0  10 - 30 ug/mL   Comment:            THERAPEUTIC CONCENTRATIONS VARY     SIGNIFICANTLY. A RANGE OF 10-30     ug/mL MAY BE AN EFFECTIVE     CONCENTRATION FOR MANY PATIENTS.     HOWEVER, SOME ARE BEST TREATED     AT CONCENTRATIONS OUTSIDE THIS     RANGE.     ACETAMINOPHEN CONCENTRATIONS     >150 ug/mL AT 4 HOURS AFTER     INGESTION AND >50  ug/mL AT 12     HOURS AFTER INGESTION ARE     OFTEN ASSOCIATED WITH TOXIC     REACTIONS.  CBC     Status: Abnormal   Collection Time    01/08/13  3:36 AM      Result Value Range   WBC 15.3 (*) 4.0 - 10.5 K/uL   RBC 5.29  4.22 - 5.81 MIL/uL   Hemoglobin 19.3 (*) 13.0 - 17.0 g/dL   HCT 16.1 (*) 09.6 - 04.5 %   MCV 99.6  78.0 - 100.0 fL   MCH 36.5 (*) 26.0 - 34.0 pg   MCHC 36.6 (*) 30.0 - 36.0 g/dL   RDW 40.9  81.1 - 91.4 %   Platelets 189  150 - 400 K/uL  COMPREHENSIVE METABOLIC PANEL     Status: Abnormal   Collection Time    01/08/13  3:36 AM      Result Value Range   Sodium 134 (*) 135 - 145 mEq/L   Potassium 3.8  3.5 - 5.1 mEq/L   Chloride 97  96 - 112 mEq/L   CO2 25  19 - 32 mEq/L   Glucose, Bld 97  70 - 99 mg/dL   BUN 6  6 - 23 mg/dL   Creatinine, Ser 7.82  0.50 - 1.35 mg/dL   Calcium 9.2  8.4 - 95.6 mg/dL   Total Protein 7.1  6.0 - 8.3 g/dL   Albumin 4.0  3.5 - 5.2 g/dL   AST 29  0 - 37 U/L   ALT 52  0 - 53 U/L   Alkaline Phosphatase 61  39 - 117 U/L   Total Bilirubin 0.5  0.3 - 1.2 mg/dL   GFR calc non Af Amer >90  >90 mL/min   GFR calc Af Amer >90  >90 mL/min   Comment:            The eGFR has been calculated     using the CKD EPI equation.     This calculation has not been     validated in all clinical     situations.  eGFR's persistently     <90 mL/min signify     possible Chronic Kidney Disease.  ETHANOL     Status: Abnormal   Collection Time    01/08/13  3:36 AM      Result Value Range   Alcohol, Ethyl (B) 190 (*) 0 - 11 mg/dL   Comment:            LOWEST DETECTABLE LIMIT FOR     SERUM ALCOHOL IS 11 mg/dL     FOR MEDICAL PURPOSES ONLY  SALICYLATE LEVEL     Status: Abnormal   Collection Time    01/08/13  3:36 AM      Result Value Range   Salicylate Lvl <2.0 (*) 2.8 - 20.0 mg/dL   Labs are reviewed and are pertinent for Polysubstance dependence  Current Facility-Administered Medications  Medication Dose Route Frequency Provider Last Rate Last  Dose  . acetaminophen (TYLENOL) tablet 650 mg  650 mg Oral Q4H PRN Junius Finner, PA-C      . alum & mag hydroxide-simeth (MAALOX/MYLANTA) 200-200-20 MG/5ML suspension 30 mL  30 mL Oral PRN Junius Finner, PA-C      . ibuprofen (ADVIL,MOTRIN) tablet 600 mg  600 mg Oral Q8H PRN Junius Finner, PA-C      . LORazepam (ATIVAN) tablet 0-4 mg  0-4 mg Oral Q6H Junius Finner, PA-C       Followed by  . [START ON 01/10/2013] LORazepam (ATIVAN) tablet 0-4 mg  0-4 mg Oral Q12H Junius Finner, PA-C      . LORazepam (ATIVAN) tablet 1 mg  1 mg Oral Q8H PRN Junius Finner, PA-C      . nicotine (NICODERM CQ - dosed in mg/24 hours) patch 21 mg  21 mg Transdermal Daily Junius Finner, PA-C   21 mg at 01/08/13 1045  . ondansetron (ZOFRAN) tablet 4 mg  4 mg Oral Q8H PRN Junius Finner, PA-C      . thiamine (VITAMIN B-1) tablet 100 mg  100 mg Oral Daily Junius Finner, PA-C   100 mg at 01/08/13 1044   Or  . thiamine (B-1) injection 100 mg  100 mg Intravenous Daily Junius Finner, PA-C      . zolpidem (AMBIEN) tablet 5 mg  5 mg Oral QHS PRN Junius Finner, PA-C       Current Outpatient Prescriptions  Medication Sig Dispense Refill  . mesalamine (ASACOL) 400 MG EC tablet Take 400 mg by mouth daily.        Psychiatric Specialty Exam:     Blood pressure 120/92, pulse 97, temperature 98.3 F (36.8 C), temperature source Oral, resp. rate 16, height 6\' 2"  (1.88 m), weight 80.74 kg (178 lb), SpO2 96.00%.Body mass index is 22.84 kg/(m^2).  General Appearance: Casual  Eye Contact::  Good  Speech:  Normal Rate  Volume:  Decreased  Mood:  Euthymic  Affect:  Appropriate  Thought Process:  Coherent  Orientation:  Full (Time, Place, and Person)  Thought Content:  NA  Suicidal Thoughts:  No  Homicidal Thoughts:  No  Memory:  Immediate;   Good Recent;   Good Remote;   Good  Judgement:  Poor  Insight:  Good  Psychomotor Activity:  Normal  Concentration:  Good  Recall:  NA  Akathisia:  No  Handed:  Right  AIMS (if  indicated):     Assets:  Desire for Improvement  Sleep:      Treatment Plan Summary: Consult with and face to face interview with Dr Lucianne Muss Patient  will be admitted to our inpatient unit for substance detox We will use librium for detox and Amantadine for cocaine detox He will be referred to ant long term facility to continue treatment and rehabilitation.  Daily contact with patient to assess and evaluate symptoms and progress in treatment Medication management  Earney Navy   PMHNP-BC 01/08/2013 1:11 PM

## 2013-01-08 NOTE — ED Notes (Signed)
Mom took home pt belongings - blue slip on shoes, shorts, plaid boxers and a t-shirt; mom also took cell phone and charger per pt.

## 2013-01-08 NOTE — Progress Notes (Signed)
D.  Pt bright and pleasant on approach, no acute signs or symptoms of withdrawal at this time.  Positive for evening AA group.  Interacting appropriately within milieu.  Denies SI/HI/hallucinations at this time.  A.  Support and encouragement offered.  Medication given as ordered.R.  Pt remains safe on unit, will continue to monitor.

## 2013-01-08 NOTE — Progress Notes (Signed)
[facility] provided patient with a [employer] Orange Card application, highlighting [facility].  °

## 2013-01-08 NOTE — BH Assessment (Signed)
[facility] Assessment Note    Per Rosey Bath, pt assigned to Rm [id number] to the service of Geoffery Lyons, MD.  At 15:58 I spoke to Lequita Asal, Ecologist, and notified her.  Doylene Canning, MA Assessment Counselor 01/08/2013 @ 16:01

## 2013-01-08 NOTE — Progress Notes (Signed)
Pt attended A/A 

## 2013-01-08 NOTE — Progress Notes (Addendum)
Patient ID: Justin [name], male   DOB: 12/26/1985, 27 y.o.   MRN: [id number] Pt denies SI/HI/AVH.  Pt denies any history of physical, verbal, or sexual abuse.  Pt admitted today for Polysubstance abuse including ETOH, cocaine, and THC.  Pt UDS negative of admit.  BAL was 190.  Pt admits to drinking 15-30 beers daily.  Pt has not used cocaine in the past 3 days.  Pt last smoked THC yesterday.  Pt currently lives with his mother and is unemployed.  Pt states that in January he broke his foot which led to his termination at work.  Pt states that he had a breakup, with his fiance, in Nov of 2013.  Pt states that he has never been to detox before.  Pt has lost 15-20 lbs in the past month due to a decrease in appetite.  Pt has legal charges pending for possession of THC.  His court date is on 03/06/13.  Pt's CIWA on admit was 1 for anxiety.  Pt cooperative.  Pt oriented to unit.  Pt had no belongings on admission.  He states that all of his belongings were given to his mother.

## 2013-01-08 NOTE — ED Notes (Signed)
Pt states that he wants detox from Cocaine, Marijuania and ETOH, pt states he has been using for 3 years, last used cocaine 3 days ago, pt has been drinking today.

## 2013-01-08 NOTE — ED Notes (Signed)
Patient arrived to unit requesting detox from ETOH, marijuana and cocaine. Pt with bright affect and is cooperative with assessment. Pt denies SI/HI A/V hallucinations. No distress noted. Pt states "I just want to rest right now, goodnight".

## 2013-01-08 NOTE — ED Provider Notes (Signed)
Patient accepted at [facility] under Dr. Dub Mikes. Stable for transfer.   Richardean Canal, MD 01/08/13 1640

## 2013-01-08 NOTE — Tx Team (Signed)
Initial Interdisciplinary Treatment Plan  PATIENT STRENGTHS: (choose at least two) Ability for insight Average or above average intelligence Communication skills General fund of knowledge Motivation for treatment/growth  PATIENT STRESSORS: Substance abuse   PROBLEM LIST: Problem List/Patient Goals Date to be addressed Date deferred Reason deferred Estimated date of resolution  Substance Abuse                                                       DISCHARGE CRITERIA:  Motivation to continue treatment in a less acute level of care  PRELIMINARY DISCHARGE PLAN: Outpatient therapy  PATIENT/FAMIILY INVOLVEMENT: This treatment plan has been presented to and reviewed with the patient, Justin [name], and/or family member.  The patient and family have been given the opportunity to ask questions and make suggestions.  Justin [name] [name] 01/08/2013, 5:55 PM

## 2013-01-09 ENCOUNTER — Encounter (HOSPITAL_COMMUNITY): Payer: Self-pay | Admitting: Psychiatry

## 2013-01-09 DIAGNOSIS — F1020 Alcohol dependence, uncomplicated: Principal | ICD-10-CM

## 2013-01-09 DIAGNOSIS — F39 Unspecified mood [affective] disorder: Secondary | ICD-10-CM

## 2013-01-09 DIAGNOSIS — F1210 Cannabis abuse, uncomplicated: Secondary | ICD-10-CM

## 2013-01-09 DIAGNOSIS — F4310 Post-traumatic stress disorder, unspecified: Secondary | ICD-10-CM | POA: Diagnosis present

## 2013-01-09 NOTE — [facility] Suicide Risk Assessment (Signed)
Suicide Risk Assessment  Admission Assessment     Nursing information obtained from:  Patient Demographic factors:  Male;Caucasian;Unemployed;Access to firearms Current Mental Status:    Loss Factors:  Loss of significant relationship;Legal issues;Financial problems / change in socioeconomic status;Decrease in vocational status Historical Factors:  Family history of mental illness or substance abuse Risk Reduction Factors:  Living with another person, especially a relative;Religious beliefs about death  CLINICAL FACTORS:   Severe Anxiety and/or Agitation Alcohol/Substance Abuse/Dependencies More than one psychiatric diagnosis  COGNITIVE FEATURES THAT CONTRIBUTE TO RISK:  Closed-mindedness Polarized thinking Thought constriction (tunnel vision)    SUICIDE RISK:   Moderate:  Frequent suicidal ideation with limited intensity, and duration, some specificity in terms of plans, no associated intent, good self-control, limited dysphoria/symptomatology, some risk factors present, and identifiable protective factors, including available and accessible social support.  PLAN OF CARE: Supportive approach/coping skills/relapse prevention                               Detox                               Reassess and address the co morbidities  I certify that inpatient services furnished can reasonably be expected to improve the patient's condition.  Justin [name] A 01/09/2013, 4:47 PM

## 2013-01-09 NOTE — Progress Notes (Signed)
Psychoeducational Group Note  Date:  01/09/2013 Time:  0945 am  Group Topic/Focus:  Identifying Needs:   The focus of this group is to help patients identify their personal needs that have been historically problematic and identify healthy behaviors to address their needs.  Participation Level:  Did Not Attend  Andrena Mews 01/09/2013,3:22 PM

## 2013-01-09 NOTE — Progress Notes (Signed)
D.  Pt pleasant on approach, minimal withdrawal symptoms.  Interacting appropriately within milieu.  Positive for evening group, see group notes.   Denies SI/HI/hallucinations at this time.  Reports that he did not sleep well last night, hopeful that he will tonight.  A.  Support and encouragement offered, encouraged Pt to request second Trazodone should he need it before 2 AM.  R.  Pt remains safe on unit,will continue to monitor.

## 2013-01-09 NOTE — [facility] Group Notes (Signed)
[facility] Group Notes:  (Clinical Social Work)  01/09/2013     10-11AM  Summary of Progress/Problems:   The main focus of today's process group was for the patient to identify ways in which they have in the past sabotaged their own recovery. Motivational Interviewing was utilized to ask the group members what they get out of their substance use, and what reasons they may have for wanting to change.  The Stages of Change were explained using a handout, and patients identified where they currently are with regard to stages of change.  The patient expressed that he has never before now tried to quit.  His mother is a Research scientist (physical sciences), and wants him to stop drinking.  The patient stated he loves drinking, that if the ocean were alcohol he would jump in with a straw, but stated that now he has been mixing it with other drugs and has gotten out of control.  He is a social butterfly but has been chasing people out of his life.  He states that he wants to change because it would be nice not to be the black sheep of the family forever.  He feels he is in the Preparation Stage of change, and is [facility], as well as a group with the [facility] in [place] and join both AA and [place] which he has never done before in order to have some accountability.  Type of Therapy:  Group Therapy - Process   Participation Level:  Active  Participation Quality:  Appropriate, Attentive and Sharing  Affect:  Appropriate  Cognitive:  Appropriate and Oriented  Insight:  Engaged  Engagement in Therapy:  Engaged  Modes of Intervention:  Education, Support and Processing, Motivational Interviewing  [employer], LCSW 01/09/2013, 11:20 AM

## 2013-01-09 NOTE — Progress Notes (Signed)
D   Pt is pleasant and cooperative   He reports some symptoms of withdrawal including increased anxiety and moderate tremors   He denies suicidal ideation and contracts for safety  He has been compliant with treatment and interacts well with others A   Verbal support given  Medications administered and effectiveness monitored   Q 15 min checks R   Pt safe at present
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
## 2013-01-09 NOTE — B[facility] Group Notes (Signed)
[facility] Group Notes:  (Nursing/MHT/Case Management/Adjunct)  Date:  01/09/2013  Time:  1:26 PM  Type of Therapy:  Psychoeducational Skills  Participation Level:  Did Not Attend  Buford Dresser 01/09/2013, 1:26 PM

## 2013-01-10 DIAGNOSIS — F1910 Other psychoactive substance abuse, uncomplicated: Secondary | ICD-10-CM

## 2013-01-10 MED ORDER — TRAZODONE HCL 50 MG PO TABS
50.0000 mg | ORAL_TABLET | Freq: Every evening | ORAL | Status: DC | PRN
  Administered 2013-01-10: 50 mg via ORAL
  Filled 2013-01-10: qty 14

## 2013-01-10 NOTE — [facility] Group Notes (Signed)
[facility] Group Notes:  (Nursing/MHT/Case Management/Adjunct)  Date:  01/10/2013  Time:  3:35 PM  Type of Therapy:  Nurse Education  Participation Level:  Active  Participation Quality:  Appropriate and Attentive  Affect:  Appropriate  Cognitive:  Alert and Appropriate  Insight:  Appropriate and Good  Engagement in Group:  Engaged  Modes of Intervention:  Problem-solving  Summary of Progress/Problems:  Justin [name] 01/10/2013, 3:35 PM

## 2013-01-10 NOTE — Progress Notes (Signed)
D   Pt is pleasant and cooperative   He reports some symptoms of withdrawal including increased anxiety and moderate tremors   He denies suicidal ideation and contracts for safety  He has been compliant with treatment and interacts well with others  He is very upbeat today and is interacting well with others A   Verbal support given  Medications administered and effectiveness monitored   Q 15 min checks R   Pt safe at present

## 2013-01-10 NOTE — Progress Notes (Signed)
D.  Pt pleasant on approach, denies complaints at this time.  Refused librium during day, but stated he would take a dose at night to help with sleep.  Denies withdrawal symptoms at this time.   Positive for evening AA group.  Interacting appropriately with peers on unit.  Denies SI/HI/hallucinations at this time.   A.  Support and encouragement offered  R.   Pt remains safe on unit, will continue to monitor.

## 2013-01-10 NOTE — Progress Notes (Signed)
Patient did attend the evening speaker AA meeting.  

## 2013-01-10 NOTE — Progress Notes (Signed)
NUTRITION ASSESSMENT  Pt identified as at risk on the Malnutrition Screen Tool  INTERVENTION: 1. Educated patient on the importance of nutrition and encouraged intake of food and beverages. 2. Discussed weight goals. 3. Supplements: continue MVI and thiamine daily  NUTRITION DIAGNOSIS: Unintentional weight loss related to sub-optimal intake as evidenced by pt report.   Goal: Pt to meet >/= 90% of their estimated nutrition needs.  Monitor:  PO intake  Assessment:  Patient admitted with ETOH, cocaine, and marijuana abuse.  Hx of TBI and PTSD.   Consult secondary to hx or chron's.  UBW 175-185 lbs.  States last chron's flair was approximately 3 months ago.  Reports eating well and able to find foods that he tolerates.  Reports that he does not tolerate tomatoes, raw onions, spicy or greasy foods.  Reports good intake prior to admit as well.  27 y.o. male  Height: Ht Readings from Last 1 Encounters:  01/08/13 5' 10.87" (1.8 m)    Weight: Wt Readings from Last 1 Encounters:  01/08/13 175 lb (79.379 kg)    Weight Hx: Wt Readings from Last 10 Encounters:  01/08/13 175 lb (79.379 kg)  01/08/13 178 lb (80.74 kg)  06/04/12 180 lb (81.647 kg)  10/12/10 216 lb 1.6 oz (98.022 kg)  08/21/10 217 lb 3.2 oz (98.521 kg)  05/22/09 230 lb (104.327 kg)    BMI:  Body mass index is 24.5 kg/(m^2). Pt meets criteria for wnl based on current BMI.  Estimated Nutritional Needs: Kcal: 25-30 kcal/kg Protein: > 1 gram protein/kg Fluid: 1 ml/kcal  Diet Order:   Pt is also offered choice of unit snacks mid-morning and mid-afternoon.  Pt is eating as desired.   Lab results and medications reviewed.   Oran Rein, RD, LDN Clinical Inpatient Dietitian Pager:  [phone] Weekend and after hours pager:  [phone]

## 2013-01-10 NOTE — Progress Notes (Signed)
Adult Psychoeducational Group Note  Date:  01/10/2013 Time:  3:15PM  Group Topic/Focus:  Making Healthy Choices:   The focus of this group is to help patients identify negative/unhealthy choices they were using prior to admission and identify positive/healthier coping strategies to replace them upon discharge.  Participation Level:  Active  Participation Quality:  Appropriate and Sharing  Affect:  Appropriate  Cognitive:  Appropriate  Insight: Appropriate  Engagement in Group:  Engaged  Modes of Intervention:  Discussion  Additional Comments:  Pt was active in the group discussion as Staff discussed personal support systems. Pt indicated that his girlfriend is one of his biggest supports and also indicated that he seeks his Higher Power for support.   Zacarias Pontes R 01/10/2013, 4:43 PM

## 2013-01-10 NOTE — Progress Notes (Signed)
[facility] Group Notes:  (Nursing/MHT/Case Management/Adjunct)  Date:  01/09/2013 Time:  2100 Type of Therapy:  wrap up group  Participation Level:  Active  Participation Quality:  Appropriate, Attentive and Sharing  Affect:  Appropriate  Cognitive:  Alert and Appropriate  Insight:  Good  Engagement in Group:  Engaged  Modes of Intervention:  Clarification, Education and Support  Summary of Progress/Problems:   Justin [name] 01/10/2013, 2:48 AM

## 2013-01-10 NOTE — [facility] Group Notes (Signed)
[facility] Group Notes:  (Nursing/MHT/Case Management/Adjunct)  Date:  01/10/2013  Time:  10:36 AM  Type of Therapy: Psychoeducational Skills   Participation Level: Active   Participation Quality: Appropriate   Affect: Appropriate   Cognitive: Appropriate   Insight: Appropriate   Engagement in Group: Engaged   Modes of Intervention: Problem-solving   Summary of Progress/Problems: Pt attended [employer] group, and engaged in treatment.     Jacquelyne Balint Shanta 01/10/2013, 10:36 AM

## 2013-01-10 NOTE — B[facility] Group Notes (Signed)
[facility] Group Notes:  (Clinical Social Work)  01/10/2013  10:00-11:00AM  Summary of Progress/Problems:   The main focus of today's process group was to   identify the patient's current support system and decide on other supports that can be put in place.  Four definitions/levels of support were discussed and an exercise was utilized to show how much stronger we become with additional supports.  An emphasis was placed on using counselor, doctor, therapy groups, 12-step groups, and problem-specific support groups to expand supports, as well as doing something different than has been done before. The patient expressed that he has multiple supports in place already.  Is willing to also add AA/NA and a sponsor.  Type of Therapy:  Process Group with Motivational Interviewing  Participation Level:  Minimal  Participation Quality:  Attentive  Affect:  Appropriate  Cognitive:  Appropriate  Insight:  Developing/Improving  Engagement in Therapy:  Developing/Improving  Modes of Intervention:   Education, Support and Processing, Activity  Ambrose Mantle, LCSW 01/10/2013, 12:46 PM

## 2013-01-10 NOTE — Progress Notes (Signed)
[facility] MD Progress Note  01/10/2013 2:33 PM Justin [name]  MRN:  [id number] Subjective:  Continues to be detox. He states he was able to sleep last night for the fires time in [name] long time. He also states he was able to eat [name] full meal. He states that he is more aware of how bad his situation was before he came here. He is wanting to pursue rehab trough the [place]. His mother was told that he can go the [facility] [place] [facility] and come how ever many days he would like to come waiting for [name] bed to be available at the rehab unit. He would also like to pursue help for his PTSD. He states he has gotten mor of going to the [facility] than the [place] clinic in [place]. Marland Kitchen  Diagnosis:  Alcohol Dependence Polysubstance Abuse, PTSD  ADL's:  Intact  Sleep: Fair  Appetite:  Fair  Suicidal Ideation:  Plan:  denies Intent:  denies Means:  denies Homicidal Ideation:  Plan:  denies Intent:  denies Means:  denies AEB (as evidenced by):  Psychiatric Specialty Exam: Review of Systems  HENT: Negative.   Eyes: Negative.   Respiratory: Negative.   Cardiovascular: Negative.   Gastrointestinal: Negative.   Genitourinary: Negative.   Musculoskeletal: Negative.   Skin: Negative.   Neurological: Positive for weakness.  Endo/Heme/Allergies: Negative.   Psychiatric/Behavioral: Positive for substance abuse. The patient is nervous/anxious.     Blood pressure 115/78, pulse 62, temperature 97.2 F (36.2 C), temperature source Oral, resp. rate 16, height 5' 10.87" (1.8 m), weight 79.379 kg (175 lb).Body mass index is 24.5 kg/(m^2).  General Appearance: Fairly Groomed  Patent attorney::  Fair  Speech:  Clear and Coherent  Volume:  Decreased  Mood:  Anxious and worried  Affect:  anxious  Thought Process:  Coherent and Goal Directed  Orientation:  Full (Time, Place, and Person)  Thought Content:  worries, concerns  Suicidal Thoughts:  No  Homicidal Thoughts:  No  Memory:  Immediate;   Fair Recent;   Fair Remote;   Fair   Judgement:  Fair  Insight:  Present  Psychomotor Activity:  Restlessness  Concentration:  Fair  Recall:  Fair  Akathisia:  No  Handed:  Right  AIMS (if indicated):     Assets:  Desire for Improvement  Sleep:  Number of Hours: 5.75   Current Medications: Current Facility-Administered Medications  Medication Dose Route Frequency Provider Last Rate Last Dose  . acetaminophen (TYLENOL) tablet 650 mg  650 mg Oral Q6H PRN Court Joy, PA-C      . alum & mag hydroxide-simeth (MAALOX/MYLANTA) 200-200-20 MG/5ML suspension 30 mL  30 mL Oral Q4H PRN Court Joy, PA-C      . chlordiazePOXIDE (LIBRIUM) capsule 25 mg  25 mg Oral Q6H PRN Court Joy, PA-C      . chlordiazePOXIDE (LIBRIUM) capsule 25 mg  25 mg Oral TID Court Joy, PA-C   25 mg at 01/10/13 1201   Followed by  . [START ON 01/11/2013] chlordiazePOXIDE (LIBRIUM) capsule 25 mg  25 mg Oral BH-qamhs Court Joy, PA-C       Followed by  . [START ON 01/13/2013] chlordiazePOXIDE (LIBRIUM) capsule 25 mg  25 mg Oral Daily Court Joy, PA-C      . hydrOXYzine (ATARAX/VISTARIL) tablet 25 mg  25 mg Oral Q6H PRN Court Joy, PA-C   25 mg at 01/09/13 1146  . loperamide (IMODIUM) capsule 2-4 mg  2-4 mg Oral PRN Court Joy, PA-C      . magnesium hydroxide (MILK OF MAGNESIA) suspension 30 mL  30 mL Oral Daily PRN Court Joy, PA-C      . Mesalamine (ASACOL) DR capsule 400 mg  400 mg Oral Daily Court Joy, PA-C   400 mg at 01/10/13 1610  . multivitamin with minerals tablet 1 tablet  1 tablet Oral Daily Court Joy, PA-C   1 tablet at 01/10/13 0817  . nicotine (NICODERM CQ - dosed in mg/24 hours) patch 14 mg  14 mg Transdermal Q0600 Court Joy, PA-C   14 mg at 01/10/13 0604  . ondansetron (ZOFRAN-ODT) disintegrating tablet 4 mg  4 mg Oral Q6H PRN Court Joy, PA-C      . thiamine (B-1) injection 100 mg  100 mg Intramuscular Once Court Joy, PA-C      . thiamine (VITAMIN B-1) tablet 100 mg  100 mg  Oral Daily Court Joy, PA-C   100 mg at 01/10/13 0817  . traZODone (DESYREL) tablet 50 mg  50 mg Oral QHS Court Joy, PA-C   50 mg at 01/10/13 0004    Lab Results: No results found for this or any previous visit (from the past 48 hour(s)).  Physical Findings: AIMS: Facial and Oral Movements Muscles of Facial Expression: None, normal Lips and Perioral Area: None, normal Jaw: None, normal Tongue: None, normal,Extremity Movements Upper (arms, wrists, hands, fingers): None, normal Lower (legs, knees, ankles, toes): None, normal, Trunk Movements Neck, shoulders, hips: None, normal, Overall Severity Severity of abnormal movements (highest score from questions above): None, normal Incapacitation due to abnormal movements: None, normal Patient's awareness of abnormal movements (rate only patient's report): No Awareness, Dental Status Current problems with teeth and/or dentures?: No Does patient usually wear dentures?: No  CIWA:  CIWA-Ar Total: 0 COWS:     Treatment Plan Summary: Daily contact with patient to assess and evaluate symptoms and progress in treatment Medication management  Plan: Supportive approach/coping skills/relapse prevention           Continue Detox           [name] wants to wait to after detox to be assessed for medications for his PTSD, etc.  Medical Decision Making Problem Points:  Review of psycho-social stressors (1) Data Points:  Review of medication regiment & side effects (2)  I certify that inpatient services furnished can reasonably be expected to improve the patient's condition.   Justin [name] 01/10/2013, 2:33 PM

## 2013-01-11 DIAGNOSIS — F1920 Other psychoactive substance dependence, uncomplicated: Secondary | ICD-10-CM

## 2013-01-11 DIAGNOSIS — F639 Impulse disorder, unspecified: Secondary | ICD-10-CM

## 2013-01-11 MED ORDER — TRAZODONE HCL 50 MG PO TABS: 50.0000 mg | ORAL_TABLET | Freq: Every evening | ORAL | Status: DC | PRN

## 2013-01-11 MED ORDER — MESALAMINE 400 MG PO TBEC: 400.0000 mg | DELAYED_RELEASE_TABLET | Freq: Every day | ORAL | Status: DC

## 2013-01-11 MED ORDER — CHLORDIAZEPOXIDE HCL 25 MG PO CAPS: 25.0000 mg | ORAL_CAPSULE | Freq: Every day | ORAL | Status: DC

## 2013-01-11 NOTE — Progress Notes (Signed)
Adult Psychoeducational Group Note  Date:  01/11/2013 Time:  11:00AM Group Topic/Focus:  Self Care:   The focus of this group is to help patients understand the importance of self-care in order to improve or restore emotional, physical, spiritual, interpersonal, and financial health.  Participation Level:  Active  Participation Quality:  Appropriate and Attentive  Affect:  Appropriate  Cognitive:  Alert and Appropriate  Insight: Appropriate  Engagement in Group:  Engaged  Modes of Intervention:  Discussion  Additional Comments:  Pt. Was attentive and appropriate during today's group discussion. Pt. Was able to complete self care assessment. Pt. Shared that he need to stay clean and stop being around negative people. Pt. Stated that he is going to a long term facility so that he is able to learn how to take better self. Pt shared that he would like to be around peers with the same issues to get the needed help.   Bing Plume D 01/11/2013, 1:58 PM

## 2013-01-11 NOTE — Tx Team (Signed)
Interdisciplinary Treatment Plan Update (Adult)  Date: 01/11/2013  Time Reviewed:  9:45 AM  Progress in Treatment: Attending groups: Yes Participating in groups:  Yes Taking medication as prescribed:  Yes Tolerating medication:  Yes Family/Significant othe contact made: CSW assessing Patient understands diagnosis:  Yes Discussing patient identified problems/goals with staff:  Yes Medical problems stabilized or resolved:  Yes Denies suicidal/homicidal ideation: Yes Issues/concerns per patient self-inventory:  Yes Other:  New problem(s) identified: N/A  Discharge Plan or Barriers: Pt will follow up at the [facility] for medication management and therapy, SAIOP  Reason for Continuation of Hospitalization: Stable to d/c  Comments: N/A  Estimated length of stay: D/C today  For review of initial/current patient goals, please see plan of care.  Attendees: Patient:     Family:     Physician:  Dr. Dub Mikes 01/11/2013 10:21 AM   Nursing:   Roswell Miners, RN 01/11/2013 10:21 AM   Clinical Social Worker:  Reyes Ivan, LCSWA 01/11/2013 10:21 AM   Other: Robbie Louis, RN 01/11/2013 10:21 AM   Other:  Trula Slade, LCSWA 01/11/2013 10:21 AM   Other:  Tomasita Morrow, care manager 01/11/2013 10:21 AM   Other:     Other:    Other:    Other:    Other:    Other:    Other:     Scribe for Treatment Team:   Carmina Miller, 01/11/2013 , 10:21 AM

## 2013-01-11 NOTE — Progress Notes (Signed)
[facility] Adult Case Management Discharge Plan :  Will you be returning to the same living situation after discharge: Yes,  returning home At discharge, do you have transportation home?:Yes,  access to transportation Do you have the ability to pay for your medications:Yes,  access to meds  Release of information consent forms completed and in the chart;  Patient's signature needed at discharge.  Patient to Follow up at: Follow-up Information   Follow up with [facility] On 01/12/2013. (Follow up with your case manager Aimee Hind for SAIOP.   Appointment scheduled with Dr. Ethelene Hal at 1:00 pm on 01/12/13.)    Contact information:   [address], [place], [place] [address] Phone: [phone] Fax: [phone]      Patient denies SI/HI:   Yes,  denies SI/HI    Safety Planning and Suicide Prevention discussed:  Yes,  N/A - pt did not admit with SI.  See suicide prevention education note.   Carmina Miller 01/11/2013, 11:31 AM

## 2013-01-11 NOTE — Progress Notes (Signed)
Patient ID: Justin [name], male   DOB: 1985-06-09, 27 y.o.   MRN: [id number] He has been discharged home and was picked up by his mother. He voiced understanding of teaching about his medication and follow up plan. He denies thoughts of wanting to harm self and all belongings was taken home with him.

## 2013-01-11 NOTE — BHH Group Notes (Signed)
[facility] LCSW Aftercare Discharge Planning Group Note   01/11/2013 8:45 AM  Participation Quality:  Alert and Appropriate   Mood/Affect:  Appropriate and Calm  Depression Rating:  1  Anxiety Rating:  1  Thoughts of Suicide:  Pt denies SI/HI  Will you contract for safety?   Yes  Current AVH:  Pt denies   Plan for Discharge/Comments:  Pt attended discharge planning group and actively participated in group.  CSW provided pt with today's workbook. Pt states that he came here for alcohol and cocaine use.  Pt states that he is planning to follow up with the [place] in [place] for [place] and [employer]  Pt states that he resides in [place].  No further needs voiced by pt at this time.    Transportation Means: Pt reports access to transportation  Supports: No supports mentioned at this time  Reyes Ivan, LCSWA 01/11/2013 9:50 AM

## 2013-01-11 NOTE — BHH Suicide Risk Assessment (Signed)
[facility] INPATIENT: Family/Significant Other Suicide Prevention Education  Suicide Prevention Education:  Education Completed; No one has been identified by the patient as the family member/significant other with whom the patient will be residing, and identified as the person(s) who will aid the patient in the event of a mental health crisis (suicidal ideations/suicide attempt). With written consent from the patient, the family member/significant other has been provided the following suicide prevention education, prior to the and/or following the discharge of the patient.  The suicide prevention education provided includes the following:  Suicide risk factors  Suicide prevention and interventions  National Suicide Hotline telephone number  [facility] assessment telephone number  [facility] Emergency Assistance 911  [facility] and/or Residential Mobile Crisis Unit telephone number Request made of family/significant other to:  Remove weapons (e.g., guns, rifles, knives), all items previously/currently identified as safety concern.  Remove drugs/medications (over-the-counter, prescriptions, illicit drugs), all items previously/currently identified as a safety concern. The family member/significant other verbalizes understanding of the suicide prevention education information provided. The family member/significant other agrees to remove the items of safety concern listed above. Pt did not c/o SI at admission, nor have they endorsed SI during their stay here. SPE not required.  Reyes Ivan, LCSWA 01/11/2013  11:13 AM

## 2013-01-11 NOTE — BHH Suicide Risk Assessment (Signed)
Suicide Risk Assessment  Discharge Assessment     Demographic Factors:  Male and Caucasian  Mental Status Per Nursing Assessment::   On Admission:     Current Mental Status by Physician: In full contact with reality. There are no suicidal ideas, plans or intent. His mood is euthymic, his affect is appropriate He is willing and motivated to pursue further rehab/ outpatient treatment. He is committed to abstinence. He has an appointment with the [facility] for rehab in  August the 27.  He is going to go to their CD IOP meanwhile. He has an appointment with Dr. Ethelene Hal in the [facility] [place]   Loss Factors: NA  Historical Factors: NA  Risk Reduction Factors:   Sense of responsibility to family, Living with another person, especially a relative, Positive social support and Positive coping skills or problem solving skills  Continued Clinical Symptoms:  Alcohol/Substance Abuse/Dependencies  Cognitive Features That Contribute To Risk: N/A   Suicide Risk:  Minimal: No identifiable suicidal ideation.  Patients presenting with no risk factors but with morbid ruminations; may be classified as minimal risk based on the severity of the depressive symptoms  Discharge Diagnoses:   AXIS I:  Alcohol Dependence, Polysubstance Abuse, PTSD AXIS II:  Deferred AXIS III:   Past Medical History  Diagnosis Date  . Crohn's disease   . ADHD (attention deficit hyperactivity disorder)   . Foot fracture, right    AXIS IV:  other psychosocial or environmental problems AXIS V:  61-70 mild symptoms  Plan Of Care/Follow-up recommendations:  Activity:  as tolerated Diet:  [facility] [place] Is patient on multiple antipsychotic therapies at discharge:  No   Has Patient had three or more failed trials of antipsychotic monotherapy by history:  No  Recommended Plan for Multiple Antipsychotic Therapies: N/A   Cam Dauphin A 01/11/2013, 11:53 AM

## 2013-01-13 NOTE — Progress Notes (Signed)
Patient Discharge Instructions:  After Visit Summary (AVS):   Faxed to:  01/13/13 Psychiatric Admission Assessment Note:   Faxed to:  01/13/13 Suicide Risk Assessment - Discharge Assessment:   Faxed to:  01/13/13 Faxed/Sent to the Next Level Care provider:  01/13/13 Faxed to [facility] @ [phone]  Jerelene Redden, 01/13/2013, 2:18 PM

## 2013-01-13 NOTE — ED Provider Notes (Signed)
Medical screening examination/treatment/procedure(s) were performed by non-physician practitioner and as supervising physician I was immediately available for consultation/collaboration.  Olivia Mackie, MD 01/13/13 [phone]

## 2013-01-14 ENCOUNTER — Encounter (HOSPITAL_COMMUNITY): Payer: Self-pay | Admitting: Emergency Medicine

## 2013-01-14 ENCOUNTER — Emergency Department (HOSPITAL_COMMUNITY)
Admission: EM | Admit: 2013-01-14 | Discharge: 2013-01-14 | Disposition: A | Discharge status: Home / Self Care | Payer: Self-pay | Attending: Emergency Medicine | Admitting: Emergency Medicine

## 2013-01-14 ENCOUNTER — Emergency Department (HOSPITAL_COMMUNITY): Payer: Self-pay

## 2013-01-14 DIAGNOSIS — K5090 Crohn's disease, unspecified, without complications: Secondary | ICD-10-CM | POA: Insufficient documentation

## 2013-01-14 DIAGNOSIS — Z8659 Personal history of other mental and behavioral disorders: Secondary | ICD-10-CM | POA: Insufficient documentation

## 2013-01-14 DIAGNOSIS — Y9301 Activity, walking, marching and hiking: Secondary | ICD-10-CM | POA: Insufficient documentation

## 2013-01-14 DIAGNOSIS — Z79899 Other long term (current) drug therapy: Secondary | ICD-10-CM | POA: Insufficient documentation

## 2013-01-14 DIAGNOSIS — F17200 Nicotine dependence, unspecified, uncomplicated: Secondary | ICD-10-CM | POA: Insufficient documentation

## 2013-01-14 DIAGNOSIS — S93402A Sprain of unspecified ligament of left ankle, initial encounter: Secondary | ICD-10-CM

## 2013-01-14 DIAGNOSIS — S93409A Sprain of unspecified ligament of unspecified ankle, initial encounter: Secondary | ICD-10-CM | POA: Insufficient documentation

## 2013-01-14 DIAGNOSIS — Y929 Unspecified place or not applicable: Secondary | ICD-10-CM | POA: Insufficient documentation

## 2013-01-14 DIAGNOSIS — X58XXXA Exposure to other specified factors, initial encounter: Secondary | ICD-10-CM | POA: Insufficient documentation

## 2013-01-14 DIAGNOSIS — Z8781 Personal history of (healed) traumatic fracture: Secondary | ICD-10-CM | POA: Insufficient documentation

## 2013-01-14 MED ORDER — OXYCODONE-ACETAMINOPHEN 5-325 MG PO TABS
1.0000 | ORAL_TABLET | Freq: Once | ORAL | Status: AC
  Administered 2013-01-14: 1 via ORAL
  Filled 2013-01-14: qty 1

## 2013-01-14 NOTE — ED Provider Notes (Signed)
CSN: [id number]     Arrival date & time 01/14/13  1729 History     First MD Initiated Contact with Patient 01/14/13 1745     Chief Complaint  Patient presents with  . left foot pain    (Consider location/radiation/quality/duration/timing/severity/associated sxs/prior Treatment) Patient is a 27 y.o. male presenting with lower extremity pain. The history is provided by the patient.  Foot Pain This is a new problem. Episode onset: 4 hours ago. The problem occurs constantly. The problem has not changed since onset.Pertinent negatives include no chest pain, no abdominal pain, no headaches and no shortness of breath. Exacerbated by: bearing weight. Nothing relieves the symptoms. He has tried nothing for the symptoms. The treatment provided no relief.    Past Medical History  Diagnosis Date  . Crohn's disease   . ADHD (attention deficit hyperactivity disorder)   . Foot fracture, right    History reviewed. No pertinent past surgical history. No family history on file. History  Substance Use Topics  . Smoking status: Current Every Day Smoker -- 1.00 packs/day    Types: Cigarettes  . Smokeless tobacco: Not on file  . Alcohol Use: 120.0 oz/week    200 Cans of beer per week     Comment: 15-30 beers daily    Review of Systems  Constitutional: Negative for fever.  HENT: Negative for rhinorrhea, drooling and neck pain.   Eyes: Negative for pain.  Respiratory: Negative for cough and shortness of breath.   Cardiovascular: Negative for chest pain and leg swelling.  Gastrointestinal: Negative for nausea, vomiting, abdominal pain and diarrhea.  Genitourinary: Negative for dysuria and hematuria.  Musculoskeletal: Negative for gait problem.  Skin: Negative for color change.  Neurological: Negative for numbness and headaches.  Hematological: Negative for adenopathy.  Psychiatric/Behavioral: Negative for behavioral problems.  All other systems reviewed and are negative.    Allergies   Review of patient's allergies indicates no known allergies.  Home Medications   Current Outpatient Rx  Name  Route  Sig  Dispense  Refill  . chlordiazePOXIDE (LIBRIUM) 25 MG capsule   Oral   Take 1 capsule (25 mg total) by mouth daily. Take one capsule 01/11/13 at bedtime and one capsule on the morning of 01/12/13 then stop.   30 capsule   0   . mesalamine (ASACOL) 400 MG EC tablet   Oral   Take 1 tablet (400 mg total) by mouth daily.         . traZODone (DESYREL) 50 MG tablet   Oral   Take 1 tablet (50 mg total) by mouth at bedtime as needed for sleep.   30 tablet   0    BP 149/121  Pulse 103  Temp(Src) 98.7 F (37.1 C) (Oral)  Resp 18  SpO2 100% Physical Exam  Nursing note and vitals reviewed. Constitutional: He is oriented to person, place, and time. He appears well-developed and well-nourished.  HENT:  Head: Normocephalic and atraumatic.  Right Ear: External ear normal.  Left Ear: External ear normal.  Nose: Nose normal.  Mouth/Throat: Oropharynx is clear and moist. No oropharyngeal exudate.  Eyes: Conjunctivae and EOM are normal. Pupils are equal, round, and reactive to light.  Neck: Normal range of motion. Neck supple.  Cardiovascular: Normal rate, regular rhythm, normal heart sounds and intact distal pulses.  Exam reveals no gallop and no friction rub.   No murmur heard. Pulmonary/Chest: Effort normal and breath sounds normal. No respiratory distress. He has no wheezes.  Abdominal: Soft.  Bowel sounds are normal. He exhibits no distension. There is no tenderness. There is no rebound and no guarding.  Musculoskeletal: Normal range of motion. He exhibits no edema and no tenderness.  2+ distal DP and PT pulses. Pt has mild altered sensation to light touch on dorsum of foot, does not follow dermatomal distribution. Mild to mod ttp of left lateral malleolus and lateral aspect of left foot. Limited rom of left ankle d/t pain. Able to move toes.   Neurological: He is  alert and oriented to person, place, and time.  Skin: Skin is warm and dry.  Psychiatric: He has a normal mood and affect. His behavior is normal.    ED Course   Procedures (including critical care time)  Labs Reviewed - No data to display Dg Ankle Complete Left  01/14/2013   *RADIOLOGY REPORT*  Clinical Data: Left ankle pain following injury  LEFT ANKLE COMPLETE - 3+ VIEW  Comparison: None.  Findings: No acute fracture or dislocation is noted.  No gross soft tissue abnormality is seen.  IMPRESSION: No acute abnormality noted.   Original Report Authenticated By: Alcide Clever, M.D.   Dg Foot Complete Left  01/14/2013   *RADIOLOGY REPORT*  Clinical Data: Left foot pain  LEFT FOOT - COMPLETE 3+ VIEW  Comparison: 08/14/2012  Findings: No acute fracture or dislocation is noted.  No gross soft tissue abnormality is seen.  IMPRESSION: No acute abnormality noted.   Original Report Authenticated By: Alcide Clever, M.D.   1. Ankle sprain, left, initial encounter     MDM  5:55 PM 27 y.o. male w reported hx of left foot fx pw sudden onset popping sensation proximal to left ankle while walking today. Pt AFVSS here, appears well on exam. Percocet for pain, plain films to r/o fx. Suspect sprain.   6:41 PM: Imaging non-contrib. Suspect sprain. Will stabilize w/ ace wrap.  I have discussed the diagnosis/risks/treatment options with the patient and believe the pt to be eligible for discharge home to follow-up with pcp as needed. We also discussed returning to the ED immediately if new or worsening sx occur. We discussed the sx which are most concerning (e.g., worsening pain, fever) that necessitate immediate return. Any new prescriptions provided to the patient are listed below.  New Prescriptions   No medications on file      Junius Argyle, MD 01/15/13 1205

## 2013-01-14 NOTE — ED Notes (Signed)
Per pt, was walking and heard a pop in his foot, now he states it is swollen and painful

## 2013-01-20 NOTE — Discharge Summary (Signed)
Physician Discharge Summary Note  Patient:  Justin [name] is an 27 y.o., male MRN:  [id number] DOB:  05-06-1986 Patient phone:  [phone] (home)  Patient address:   [address] [place] [address],   Date of Admission:  01/08/2013 Date of Discharge: 01/11/2013  Reason for Admission:  27 Y/o male who came requesting help with his alcohol and substance dependence. He has a history of PTSD, and TBI while in the service. Had been drinking more and using marijuana and cocaine becoming more agitated, belligerent affecting his relationship with family and friends. He has experienced the death of several friends during the last several months. He has also been experiencing mood swings with increased gambling  Discharge Diagnoses: Active Problems:   Alcohol dependence   Polysubstance abuse   PTSD (post-traumatic stress disorder)  Review of Systems  Constitutional: Negative.   HENT: Negative.   Eyes: Negative.   Respiratory: Negative.   Cardiovascular: Negative.   Gastrointestinal: Negative.   Genitourinary: Negative.   Musculoskeletal: Negative.   Skin: Negative.   Neurological: Negative.   Endo/Heme/Allergies: Negative.   Psychiatric/Behavioral: Positive for substance abuse. The patient is nervous/anxious.     DSM5:  Schizophrenia Disorders:   Obsessive-Compulsive Disorders:   Trauma-Stressor Disorders:  Posttraumatic Stress Disorder (309.81) Substance/Addictive Disorders:  Alcohol Related Disorder - Severe (303.90) and Cannabis Use Disorder - Severe (304.30), Cocaine Use disorders Depressive Disorders:    Axis Diagnosis:   AXIS I:  Polysubstance Dependence, PTSD, Mood disorder NOS, Impulse control NOS AXIS II:  Deferred AXIS III:   Past Medical History  Diagnosis Date  . Crohn's disease   . ADHD (attention deficit hyperactivity disorder)   . Foot fracture, right    AXIS IV:  other psychosocial or environmental problems AXIS V:  51-60 moderate symptoms  Level of  Care:  IOP  Hospital Course:  He was admitted and started in individual and group psychotherapy. He was detox with librium. He requested not to be placed on psychotropics in order to have time to get the alcohol and drugs out of his system in order to have a better sense of his baseline mood. His detox went without any complications. He was active in the milieu.   Consults:  None  Significant Diagnostic Studies:  labs: as per the chart  Discharge Vitals:   Blood pressure 131/84, pulse 74, temperature 97.4 F (36.3 C), temperature source Oral, resp. rate 16, height 5' 10.87" (1.8 m), weight 79.379 kg (175 lb). Body mass index is 24.5 kg/(m^2). Lab Results:   No results found for this or any previous visit (from the past 72 hour(s)).  Physical Findings: AIMS: Facial and Oral Movements Muscles of Facial Expression: None, normal Lips and Perioral Area: None, normal Jaw: None, normal Tongue: None, normal,Extremity Movements Upper (arms, wrists, hands, fingers): None, normal Lower (legs, knees, ankles, toes): None, normal, Trunk Movements Neck, shoulders, hips: None, normal, Overall Severity Severity of abnormal movements (highest score from questions above): None, normal Incapacitation due to abnormal movements: None, normal Patient's awareness of abnormal movements (rate only patient's report): No Awareness, Dental Status Current problems with teeth and/or dentures?: No Does patient usually wear dentures?: No  CIWA:  CIWA-Ar Total: 1 COWS:     Psychiatric Specialty Exam: See Psychiatric Specialty Exam and Suicide Risk Assessment completed by Attending Physician prior to discharge.  Discharge destination:  Home  Is patient on multiple antipsychotic therapies at discharge:  No   Has Patient had three or more failed trials of  antipsychotic monotherapy by history:  No  Recommended Plan for Multiple Antipsychotic Therapies: NA     Medication List       Indication    chlordiazePOXIDE 25 MG capsule  Commonly known as:  LIBRIUM  Take 1 capsule (25 mg total) by mouth daily. Take one capsule 01/11/13 at bedtime and one capsule on the morning of 01/12/13 then stop.   Indication:  Acute Alcohol Withdrawal Syndrome     mesalamine 400 MG EC tablet  Commonly known as:  ASACOL  Take 1 tablet (400 mg total) by mouth daily.   Indication:  Crohn's Disease     traZODone 50 MG tablet  Commonly known as:  DESYREL  Take 1 tablet (50 mg total) by mouth at bedtime as needed for sleep.   Indication:  Trouble Sleeping           Follow-up Information   Follow up with W. Reece Agar Annette Stable) [facility]. (Follow up with your case manager Aimee Hind for [facility].   Appointment scheduled with Dr. Ethelene Hal at 1:00 pm on 01/12/13.)    Contact information:   [address] [place], [place] [address] Phone: [phone] Fax: [phone]      Follow-up recommendations:  Activity:  as tolerated Diet:  regular  Comments:  Follow up the relapse prevention plan. Keep the appointment with Dr. Ethelene Hal, go to the [facility] IOP and be ready to be admitted to their residential treatment center  Total Discharge Time:  Greater than 30 minutes.  SignedRachael Fee 01/20/2013, 8:46 PM

## 2013-02-28 ENCOUNTER — Encounter (HOSPITAL_COMMUNITY): Payer: Self-pay | Admitting: Emergency Medicine

## 2013-02-28 ENCOUNTER — Emergency Department (HOSPITAL_COMMUNITY)
Admission: EM | Admit: 2013-02-28 | Discharge: 2013-02-28 | Disposition: A | Discharge status: Home / Self Care | Payer: Managed Care, Other (non HMO) | Attending: Emergency Medicine | Admitting: Emergency Medicine

## 2013-02-28 ENCOUNTER — Emergency Department (HOSPITAL_COMMUNITY): Payer: Managed Care, Other (non HMO)

## 2013-02-28 DIAGNOSIS — F17200 Nicotine dependence, unspecified, uncomplicated: Secondary | ICD-10-CM | POA: Insufficient documentation

## 2013-02-28 DIAGNOSIS — R042 Hemoptysis: Secondary | ICD-10-CM | POA: Insufficient documentation

## 2013-02-28 DIAGNOSIS — F411 Generalized anxiety disorder: Secondary | ICD-10-CM | POA: Insufficient documentation

## 2013-02-28 DIAGNOSIS — R0602 Shortness of breath: Secondary | ICD-10-CM | POA: Insufficient documentation

## 2013-02-28 DIAGNOSIS — R5381 Other malaise: Secondary | ICD-10-CM | POA: Insufficient documentation

## 2013-02-28 DIAGNOSIS — Z79899 Other long term (current) drug therapy: Secondary | ICD-10-CM | POA: Insufficient documentation

## 2013-02-28 DIAGNOSIS — R000 Tachycardia, unspecified: Secondary | ICD-10-CM | POA: Insufficient documentation

## 2013-02-28 DIAGNOSIS — R630 Anorexia: Secondary | ICD-10-CM | POA: Insufficient documentation

## 2013-02-28 DIAGNOSIS — Z8781 Personal history of (healed) traumatic fracture: Secondary | ICD-10-CM | POA: Insufficient documentation

## 2013-02-28 DIAGNOSIS — K219 Gastro-esophageal reflux disease without esophagitis: Secondary | ICD-10-CM | POA: Insufficient documentation

## 2013-02-28 DIAGNOSIS — R10816 Epigastric abdominal tenderness: Secondary | ICD-10-CM | POA: Insufficient documentation

## 2013-02-28 DIAGNOSIS — K5090 Crohn's disease, unspecified, without complications: Secondary | ICD-10-CM | POA: Insufficient documentation

## 2013-02-28 DIAGNOSIS — K2970 Gastritis, unspecified, without bleeding: Secondary | ICD-10-CM

## 2013-02-28 DIAGNOSIS — E860 Dehydration: Secondary | ICD-10-CM | POA: Insufficient documentation

## 2013-02-28 DIAGNOSIS — R110 Nausea: Secondary | ICD-10-CM | POA: Insufficient documentation

## 2013-02-28 LAB — CBC WITH DIFFERENTIAL/PLATELET
Eosinophils Absolute: 0.1 10*3/uL (ref 0.0–0.7)
Eosinophils Relative: 1 % (ref 0–5)
Hemoglobin: 18.3 g/dL — ABNORMAL HIGH (ref 13.0–17.0)
Lymphs Abs: 2.4 10*3/uL (ref 0.7–4.0)
MCH: 34.9 pg — ABNORMAL HIGH (ref 26.0–34.0)
MCHC: 35.6 g/dL (ref 30.0–36.0)
MCV: 98.1 fL (ref 78.0–100.0)
Monocytes Absolute: 1.1 10*3/uL — ABNORMAL HIGH (ref 0.1–1.0)
Monocytes Relative: 10 % (ref 3–12)
RBC: 5.24 MIL/uL (ref 4.22–5.81)

## 2013-02-28 LAB — URINALYSIS, ROUTINE W REFLEX MICROSCOPIC
Ketones, ur: NEGATIVE mg/dL
Leukocytes, UA: NEGATIVE
Nitrite: NEGATIVE
Specific Gravity, Urine: 1.021 (ref 1.005–1.030)
pH: 6.5 (ref 5.0–8.0)

## 2013-02-28 LAB — COMPREHENSIVE METABOLIC PANEL
Alkaline Phosphatase: 70 U/L (ref 39–117)
BUN: 7 mg/dL (ref 6–23)
Calcium: 9.7 mg/dL (ref 8.4–10.5)
Creatinine, Ser: 0.93 mg/dL (ref 0.50–1.35)
GFR calc Af Amer: >90 mL/min (ref >90)
Glucose, Bld: 108 mg/dL — ABNORMAL HIGH (ref 70–99)
Total Protein: 7 g/dL (ref 6.0–8.3)

## 2013-02-28 MED ORDER — PROMETHAZINE HCL 25 MG PO TABS: 25.0000 mg | ORAL_TABLET | Freq: Four times a day (QID) | ORAL | Status: DC | PRN

## 2013-02-28 MED ORDER — ONDANSETRON HCL 4 MG/2ML IJ SOLN
4.0000 mg | Freq: Once | INTRAMUSCULAR | Status: AC
  Administered 2013-02-28: 4 mg via INTRAVENOUS
  Filled 2013-02-28: qty 2

## 2013-02-28 MED ORDER — SODIUM CHLORIDE 0.9 % IV BOLUS (SEPSIS)
1000.0000 mL | Freq: Once | INTRAVENOUS | Status: AC
  Administered 2013-02-28: 1000 mL via INTRAVENOUS

## 2013-02-28 MED ORDER — ONDANSETRON HCL 4 MG PO TABS: 8.0000 mg | ORAL_TABLET | Freq: Three times a day (TID) | ORAL | Status: DC | PRN

## 2013-02-28 MED ORDER — OMEPRAZOLE 20 MG PO CPDR: 40.0000 mg | DELAYED_RELEASE_CAPSULE | Freq: Every day | ORAL | Status: DC

## 2013-02-28 MED ORDER — FAMOTIDINE IN NACL 20-0.9 MG/50ML-% IV SOLN
20.0000 mg | Freq: Once | INTRAVENOUS | Status: AC
  Administered 2013-02-28: 20 mg via INTRAVENOUS
  Filled 2013-02-28: qty 50

## 2013-02-28 NOTE — ED Notes (Signed)
Pt ambulatory to exam room with steady gait.  

## 2013-02-28 NOTE — ED Notes (Addendum)
Pt states that he has been spitting up blood x several days and has had lost 15lbs in the last 2 weeks. Started having tremors/shaking today in his arms and decided to come in. States SOB and fatigue. Quit drinking and doing drugs approx. 2 months ago.

## 2013-02-28 NOTE — ED Notes (Signed)
Pt resting quietly. Watching TV. Pt states nausea has improved a little. Family at bedside. VSS.

## 2013-02-28 NOTE — ED Provider Notes (Addendum)
CSN: [id number]     Arrival date & time 02/28/13  1819 History   First MD Initiated Contact with Patient 02/28/13 1932     Chief Complaint  Patient presents with  . Tremors  . Spitting up blood    (Consider location/radiation/quality/duration/timing/severity/associated sxs/prior Treatment) HPI Comments: Patient states for the last one month he's had progressive symptoms of not feeling well. He states it got worse for the last one week ovaries had severe nausea and waking up in the morning with dark material in the back of his mouth. He denies any dark stools and denies using NSAIDs, aspirin, decreased powders and no alcohol in the last 2 months. He denies increased stress or depression. He has lost 15 pounds over the last month but states in the last 2 weeks he has not been eating because of the nausea. He does have a history of Crohn's disease for which she takes Asacol for but states he has not followed up with GI regularly. Patient denies any abdominal pain. No coughing but has some shortness of breath. Denies fever.  The history is provided by the patient.    Past Medical History  Diagnosis Date  . Crohn's disease   . ADHD (attention deficit hyperactivity disorder)   . Foot fracture, right    No past surgical history on file. No family history on file. History  Substance Use Topics  . Smoking status: Current Every Day Smoker -- 1.00 packs/day    Types: Cigarettes  . Smokeless tobacco: Not on file  . Alcohol Use: No     Comment:  Quit drinking approx. Aug 14 was drinking 15-30 beers daily    Review of Systems  Constitutional: Positive for appetite change, fatigue and unexpected weight change. Negative for fever.  Respiratory: Positive for shortness of breath. Negative for cough.   Cardiovascular: Negative for chest pain, palpitations and leg swelling.  Gastrointestinal: Positive for nausea. Negative for vomiting, abdominal pain and blood in stool.       In the morning dark  material that he spits up  Genitourinary: Negative for dysuria.  All other systems reviewed and are negative.    Allergies  Nsaids  Home Medications   Current Outpatient Rx  Name  Route  Sig  Dispense  Refill  . mesalamine (ASACOL) 400 MG EC tablet   Oral   Take 1 tablet (400 mg total) by mouth daily.          BP 136/103  Pulse 108  Temp(Src) 98.2 F (36.8 C) (Oral)  Resp 20  SpO2 100% Physical Exam  Nursing note and vitals reviewed. Constitutional: He is oriented to person, place, and time. He appears well-developed and well-nourished. No distress.  Appears mildly anxious and uncomfortable  HENT:  Head: Normocephalic and atraumatic.  Mouth/Throat: Oropharynx is clear and moist.  Eyes: Conjunctivae and EOM are normal. Pupils are equal, round, and reactive to light.  Neck: Normal range of motion. Neck supple.  Cardiovascular: Regular rhythm and intact distal pulses.  Tachycardia present.   No murmur heard. Pulmonary/Chest: Effort normal and breath sounds normal. No respiratory distress. He has no wheezes. He has no rales.  Abdominal: Soft. He exhibits no distension. There is tenderness in the epigastric area. There is no rebound and no guarding.  Genitourinary: Rectum normal. Guaiac negative stool.  Musculoskeletal: Normal range of motion. He exhibits no edema and no tenderness.  Neurological: He is alert and oriented to person, place, and time.  Skin: Skin is warm and  dry. No rash noted. No erythema.  Psychiatric: He has a normal mood and affect. His behavior is normal.    ED Course  Procedures (including critical care time) Labs Review Labs Reviewed  CBC WITH DIFFERENTIAL - Abnormal; Notable for the following:    WBC 11.8 (*)    Hemoglobin 18.3 (*)    MCH 34.9 (*)    Neutro Abs 8.2 (*)    Monocytes Absolute 1.1 (*)    All other components within normal limits  COMPREHENSIVE METABOLIC PANEL - Abnormal; Notable for the following:    Glucose, Bld 108 (*)     All other components within normal limits  URINALYSIS, ROUTINE W REFLEX MICROSCOPIC  OCCULT BLOOD X 1 CARD TO LAB, STOOL  OCCULT BLOOD, POC DEVICE   Imaging Review Dg Chest 2 View  02/28/2013   CLINICAL DATA:  Hemoptysis.  EXAM: CHEST  2 VIEW  COMPARISON:  06/18/2010.  FINDINGS: The cardiac silhouette, mediastinal and hilar contours are normal and stable. The lungs are clear. No pleural effusion. The bony thorax is intact.  IMPRESSION: Normal chest x-ray.   Electronically Signed   By: Loralie Champagne M.D.   On: 02/28/2013 20:19    MDM   1. Dehydration   2. GERD (gastroesophageal reflux disease)   3. Gastritis     Patient presents with the complaint of not feeling well for the last month but worse over the last one week. He states that he wakes up in the morning and has a terrible taste in his mouth and what he thinks is dark blood in his mouth. He has severe nausea but has not been eating and drinking because of the nausea. No vomiting. He denies any hematemesis and states he just wakes up and spits up this dark material. He denies any change in his bowels. He denies any coughing but states that he has been fatigued and short of breath. Patient does smoke daily. She denies any chest pain and he denies abdominal pain however on exam he does have epigastric pain.  Patient denies increased stress, NSAID use prior history of heavy alcohol abuse but has not used alcohol in over a month. He is taking Asacol for Crohn's disease but has not been evaluated recently by GI but she says to have upper and lower endoscopy Q6 months. His last endoscopy was approximately 2 years ago.  Patient's hemoglobin is heme concentrated 18 and he appears dehydrated. He was given IV fluids, Pepcid and Zofran. Hemoccult negative. Low suspicion the patient is actually vomiting blood but nothing more likely that it's dark stomach contents. Low suspicion for PE or pulmonary pathology at this time. Patient is having 100% and he  denies hemoptysis. Urine is pending but given patient is feeling better feel that he needs to restart on GERD medication which he states he was taken off of about a year ago and told that he did not need it. Patient does not have an acute abdomen at this time requiring CT. Urine currently pending. Patient given a second liter.    Gwyneth Sprout, MD 02/28/13 1610  Gwyneth Sprout, MD 02/28/13 2256

## 2013-08-14 ENCOUNTER — Encounter (HOSPITAL_COMMUNITY): Payer: Self-pay | Admitting: Emergency Medicine

## 2013-08-14 ENCOUNTER — Emergency Department (HOSPITAL_COMMUNITY)
Admission: EM | Admit: 2013-08-14 | Discharge: 2013-08-14 | Disposition: A | Discharge status: Home / Self Care | Payer: Managed Care, Other (non HMO) | Attending: Emergency Medicine | Admitting: Emergency Medicine

## 2013-08-14 DIAGNOSIS — Z79899 Other long term (current) drug therapy: Secondary | ICD-10-CM | POA: Insufficient documentation

## 2013-08-14 DIAGNOSIS — K5090 Crohn's disease, unspecified, without complications: Secondary | ICD-10-CM | POA: Insufficient documentation

## 2013-08-14 DIAGNOSIS — Z8781 Personal history of (healed) traumatic fracture: Secondary | ICD-10-CM | POA: Insufficient documentation

## 2013-08-14 DIAGNOSIS — Z8659 Personal history of other mental and behavioral disorders: Secondary | ICD-10-CM | POA: Insufficient documentation

## 2013-08-14 DIAGNOSIS — F17200 Nicotine dependence, unspecified, uncomplicated: Secondary | ICD-10-CM | POA: Insufficient documentation

## 2013-08-14 DIAGNOSIS — K047 Periapical abscess without sinus: Secondary | ICD-10-CM | POA: Insufficient documentation

## 2013-08-14 MED ORDER — PENICILLIN V POTASSIUM 500 MG PO TABS: 500.0000 mg | ORAL_TABLET | Freq: Four times a day (QID) | ORAL | Status: AC

## 2013-08-14 MED ORDER — HYDROCODONE-ACETAMINOPHEN 5-325 MG PO TABS: 1.0000 | ORAL_TABLET | ORAL | Status: DC | PRN

## 2013-08-14 NOTE — ED Provider Notes (Signed)
Medical screening examination/treatment/procedure(s) were performed by non-physician practitioner and as supervising physician I was immediately available for consultation/collaboration.     Aftin Lye, MD 08/14/13 1053 

## 2013-08-14 NOTE — ED Notes (Addendum)
C?O having dental pain today.  States that he is to have oral surgery on Tuesday for a root canal and crowns.  States that the pain is in the second molar in his right lower jaw.  States that a lump developed yesterday and began draining this this AM.  A blackened area is noted on his gum at the root of his second molar. No drainage is noted at this time

## 2013-08-14 NOTE — Discharge Instructions (Signed)
Read the information below.  Use the prescribed medication as directed.  Please discuss all new medications with your pharmacist.  Do not take additional tylenol while taking the prescribed pain medication to avoid overdose.  You may return to the Emergency Department at any time for worsening condition or any new symptoms that concern you.  Please call the dentist listed above within 48 hours to schedule a close follow up appointment.  If you develop fevers, swelling in your face, difficulty swallowing or breathing, return to the ER immediately for a recheck.  ° ° °Dental Abscess °A dental abscess is a collection of infected fluid (pus) from a bacterial infection in the inner part of the tooth (pulp). It usually occurs at the end of the tooth's root.  °CAUSES  °· Severe tooth decay. °· Trauma to the tooth that allows bacteria to enter into the pulp, such as a broken or chipped tooth. °SYMPTOMS  °· Severe pain in and around the infected tooth. °· Swelling and redness around the abscessed tooth or in the mouth or face. °· Tenderness. °· Pus drainage. °· Bad breath. °· Bitter taste in the mouth. °· Difficulty swallowing. °· Difficulty opening the mouth. °· Nausea. °· Vomiting. °· Chills. °· Swollen neck glands. °DIAGNOSIS  °· A medical and dental history will be taken. °· An examination will be performed by tapping on the abscessed tooth. °· X-rays may be taken of the tooth to identify the abscess. °TREATMENT °The goal of treatment is to eliminate the infection. You may be prescribed antibiotic medicine to stop the infection from spreading. A root canal may be performed to save the tooth. If the tooth cannot be saved, it may be pulled (extracted) and the abscess may be drained.  °HOME CARE INSTRUCTIONS °· Only take over-the-counter or prescription medicines for pain, fever, or discomfort as directed by your caregiver. °· Rinse your mouth (gargle) often with salt water (¼ tsp salt in 8 oz [250 ml] of warm water) to  relieve pain or swelling. °· Do not drive after taking pain medicine (narcotics). °· Do not apply heat to the outside of your face. °· Return to your dentist for further treatment as directed. °SEEK MEDICAL CARE IF: °· Your pain is not helped by medicine. °· Your pain is getting worse instead of better. °SEEK IMMEDIATE MEDICAL CARE IF: °· You have a fever or persistent symptoms for more than 2 3 days. °· You have a fever and your symptoms suddenly get worse. °· You have chills or a very bad headache. °· You have problems breathing or swallowing. °· You have trouble opening your mouth. °· You have swelling in the neck or around the eye. °Document Released: 05/20/2005 Document Revised: 02/12/2012 Document Reviewed: 08/28/2010 °ExitCare® Patient Information ©2014 [employer]. ° ° °Emergency Department Resource Guide °1) Find a Doctor and Pay Out of Pocket °Although you won't have to find out who is covered by your insurance plan, it is a good idea to ask around and get recommendations. You will then need to call the office and see if the doctor you have chosen will accept you as a new patient and what types of options they offer for patients who are self-pay. Some doctors offer discounts or will set up payment plans for their patients who do not have insurance, but you will need to ask so you aren't surprised when you get to your appointment. ° °2) Contact Your Local Health Department °Not all health departments have doctors that can   see patients for sick visits, but many do, so it is worth a call to see if yours does. If you don't know where your local health department is, you can check in your phone book. The CDC also has a tool to help you locate your state's health department, and many state websites also have listings of all of their local health departments. ° °3) Find a Walk-in Clinic °If your illness is not likely to be very severe or complicated, you may want to try a walk in clinic. These are popping up  all over the country in pharmacies, drugstores, and shopping centers. They're usually staffed by nurse practitioners or physician assistants that have been trained to treat common illnesses and complaints. They're usually fairly quick and inexpensive. However, if you have serious medical issues or chronic medical problems, these are probably not your best option. ° °No Primary Care Doctor: °- Call [facility] at  832-8000 - they can help you locate a primary care doctor that  accepts your insurance, provides certain services, etc. °- Physician Referral Service- 1-800-533-3463 ° °Chronic Pain Problems: °Organization         Address  Phone   Notes  °[facility]  (336) 297-2271 Patients need to be referred by their primary care doctor.  ° °Medication Assistance: °Organization         Address  Phone   Notes  °[place] [facility] [address] °[place], [place] [address] (336) 641-8030 --Must be a resident of [place] °-- Must have NO insurance coverage whatsoever (no Medicaid/ Medicare, etc.) °-- The pt. MUST have a primary care doctor that directs their care regularly and follows them in the community °  °[facility]  (866) 331-1348   °[employer]  (888) 892-1162   ° °Agencies that provide inexpensive medical care: °Organization         Address  Phone   Notes  °[facility]  (336) 832-8035   °[facility]    (336) 832-7272   °[facility] [address] °[place], [place] [address] (336) 832-4777   °[facility] [address], °[place] (336) 271-4999   °[facility]    (336) 373-0678   °[facility]    (336) 272-1050   °[facility] ° [address], [place] Phone:  (336) 832-4444, Fax:  (336) 832-4440 Hours of Operation:  9 am - 6 pm, M-F.  Also accepts Medicaid/Medicare and self-pay.  °[facility] ° [address], [place] Phone: (336) 832-3150, Fax: (336) 832-3151. Hours of Operation:  8:30 am - 5:30 pm, M-F.  Also accepts Medicaid and self-pay.  °[facility] [place] [address], [place] Phone: (336) 878-6027   °[facility] [address], [place], [place] (336)723-1848, Ext. 123 Mondays & Thursdays: 7-9 AM.  First 15 patients are seen on a first come, first serve basis. °  ° °Medicaid-accepting [place] Providers: ° °Organization         Address  Phone   Notes  °[facility] [address], [place] (336) 641-2100 Also accepts self-pay patients.  °[facility] [address], [place] ° (336) 856-9996   °[facility] [address],  (336) 288-8857   °[facility] [address] [place] [address],   [place] (336) 299-7000   °Veita Bland [address], [place]  ° (336) 373-1557 Only accepts [place] Access Medicaid patients after they have their name applied to their card.  ° °Self-Pay (no insurance) in [place]: ° °Organization         Address  Phone   Notes  °Sickle Cell Patients, [facility] [address], [place] (336) 832-1970   °[facility] [address], [place] (336) 832-4400   °[facility] ° [address], [place] (336) 992-4800   °[facility]/Dr. Osei-Bonsu ° [address] [place] [address], [place] or [address], [place] (336) 841-8500 Phone number for both [place] and [place] locations is the same.  °[facility] [address], [place] (336) 299-0000   °[facility] [address] [place] [address], [place] or [address] (336) 852-7530 °(336) 878-2260   °[facility] [address], [place] (336) 350-1642, phone; (336) 294-5005, fax Sees patients 1st and 3rd Saturday of every  month.  Must not qualify for public or private insurance (i.e. Medicaid, Medicare, [place] Health Choice, Veterans' Benefits) • Household income should be no more than 200% of the poverty level •The clinic cannot treat you if you are pregnant or think you are pregnant • Sexually transmitted diseases are not treated at the clinic.  ° ° °Dental Care: °Organization         Address  Phone  Notes  °[facility] [address], [place] (336) 641-6152 Accepts children up to age 21 who are enrolled in Medicaid or [place] Health Choice; pregnant women with a Medicaid card; and children who have applied for Medicaid or [place] Health Choice, but were declined, whose parents can pay a reduced fee at time of service.  °[facility] [place]  [address], [place] (336) 641-7733 Accepts children up to age 21 who are enrolled in Medicaid or [place] Health Choice; pregnant women with a Medicaid card; and children who have applied for Medicaid or [place] Health Choice, but were declined, whose parents can pay a reduced fee at time of service.  °[facility] ° [address], [place] (336) 641-4533 Patients are seen by appointment only. Walk-ins are not accepted. [facility] will see patients 18 years of age and older. °Monday - Tuesday (8am-5pm) °Most Wednesdays (8:30-5pm) °$30 per visit, cash only  °[facility] ° [address], [place] (336) 641-4533 Patients are seen by appointment only. Walk-ins are not accepted. [facility] will see patients 18 years of age and older. °One Wednesday Evening (Monthly: Volunteer Based).  $30 per visit, cash only  °[facility]  (919) 537-3737 for adults; Children under age 4, call Graduate Pediatric Dentistry at (919) [phone]. Children aged 4-14, please call (919) 537-3737 to request a pediatric application. ° Dental  services are provided in all areas of dental care including fillings, crowns and bridges, complete and partial dentures, implants, gum treatment, root canals, and extractions. Preventive care is also provided. Treatment is provided to both adults and children. °Patients are selected via a lottery and there is often a waiting list. °  °[facility] [address], °[place] ° (336) 763-8833 [email or web address] °  °[facility] [address],   [place], [place] (336)723-1848, Ext. 123 Second and Fourth Thursday of each month, opens at 6:30 AM; Clinic ends at 9 AM.  Patients are seen on a first-come first-served basis, and a limited number are seen during each clinic.  ° °[facility] ° [address], [place], [place] (336) 723-7904   Eligibility Requirements °You must have lived in [place], [place], or [place] counties for at least the last three months. °  You cannot be eligible for state or federal sponsored healthcare insurance, including Veterans Administration, Medicaid, or Medicare. °  You generally cannot be eligible for healthcare insurance through your employer.  °  How to apply: °Eligibility screenings are held every Tuesday and Wednesday afternoon from 1:00 pm until 4:00 pm. You do not need an appointment for the interview!  °[facility] [address], [place], [place] 336-631-2330   °[facility]  336-342-8273   °[facility]  336-703-3100   °[facility]  336-570-6415   ° °Behavioral Health Resources in the Community: °Intensive Outpatient Programs °Organization         Address  Phone  Notes  °[facility] [address], [place], [place] 336-878-6098   °[facility] [address], [place], [place] 336-832-9800   °[facility] [address], [place], [place] ° 336-882-2125   °[facility] [address],    °[place], [place] 1-800-853-5163 or 336-641-4981   °Substance Abuse Resources °Organization         Address  Phone  Notes  °[facility]  336-882-2125   °[facility]  336-784-9470   °[facility]  336-285-9073   °[facility]  336-845-3988   °Residential & Outpatient Substance Abuse Program  1-800-659-3381   °Psychological Services °Organization         Address  Phone  Notes  °[facility]  336- 832-9600   °[facility]  336- 378-7881   °[facility] [address], [place] 1-800-853-5163 or 336-641-4981   ° °Mobile Crisis Teams °Organization         Address  Phone  Notes  °[facility], Mobile Crisis Care Unit  1-877-626-1772   °Assertive °[facility] ° [address] [place], [place] 336-834-9664   °Sharon DeEsch [address] °[place] 336-554-5454   ° °Self-Help/Support Groups °Organization         Address  Phone             Notes  °[facility] - variety of support groups  336- 373-1402 Call for more information  °Narcotics Anonymous (NA), [facility] [address], °[place]  2 meetings at this location  ° °Residential Treatment Programs °Organization         Address  Phone  Notes  °[facility] [address],    °[place]  1-866-801-8205   °[facility] ° [address], [place], [place] 704-293-8524   °[facility] [address], [place] 336-845-3988 Admissions: 8am-3pm M-F  °[facility] [address]    °[place], [place] 336-841-1104   °[facility] [address], [place], [place] 336-379-7146   °[facility] [address]  °[place], [place] 336-285-9073   °[facility] - Intensive Outpatient [address], ,  336-852-3033   °  [facility] [address]  °[place], [place]  1-877-615-2722 or 336-784-9470   °[facility] [address] [place], [place] 336-227-7417 Accepts Medicaid  °[facility] [address]  °[place] 1-800-659-3381 Substance Abuse/Addiction Treatment  ° °[place] Behavioral Health Resources °Organization         Address  Phone  Notes  °[facility]  (888) 581-9988   °Julie Brannon, PhD [address] [place], [place]   (336) 349-5553 or (336) 951-0000   °[facility]   [address] °[place], [place] (336) 349-4454   °[facility] [address], [place], [place] (336) 342-8316 Insurance/Medicaid/sponsorship through [facility]  °[facility] [address]                                    [place], [place] (336) 342-8316 Therapy/tele-psych/case  °[facility] [address]  ° [place], [place] (336) 349-2233    °Dr. Arfeen  (336) 349-4544   °[facility] [place]  [employer] [place] [facility] 1) [address], [place] °2) [address], [place] °3)  [address], [place] (336) 349-3220 °(336) 342-7768 ° °(336) 342-8140   °[place] [facility] (336) 342-1394 or (336) 342-3537 (After Hours)    ° ° ° ° °

## 2013-08-14 NOTE — ED Notes (Signed)
Pt presents to department for evaluation of R lower dental pain. Ongoing for several days. No relief with ibuprofen and tylenol. 10/10 pain upon arrival. Pt is alert and oriented x4.

## 2013-08-14 NOTE — ED Provider Notes (Signed)
CSN: [id number]     Arrival date & time 08/14/13  0848 History  This chart was scribed for non-physician practitioner, Trixie Dredge, PA-C working with Geoffery Lyons, MD by Greggory Stallion, ED scribe. This patient was seen in room TR08C/TR08C and the patient's care was started at 9:48 AM.   Chief Complaint  Patient presents with  . Dental Pain   The history is provided by the patient. No language interpreter was used.   HPI Comments: Justin [name] is a 28 y.o. male who presents to the Emergency Department complaining of gradual onset, constant right lower dental pain that started 3 days ago. He states his tooth broke 3 days ago and has had bloody, pus drainage from the area. Pt states he has also had nausea. He has taken 800 mg ibuprofen every 4 hours x 1 day and small amount of tylenol with no relief. Denies sore throat, trouble swallowing, hematochezia, emesis. Pt has a history of alcohol abuse but states he is okay with taking narcotics.   Past Medical History  Diagnosis Date  . Crohn's disease   . ADHD (attention deficit hyperactivity disorder)   . Foot fracture, right    History reviewed. No pertinent past surgical history. History reviewed. No pertinent family history. History  Substance Use Topics  . Smoking status: Current Every Day Smoker -- 1.00 packs/day    Types: Cigarettes  . Smokeless tobacco: Not on file  . Alcohol Use: No    Review of Systems  HENT: Positive for dental problem. Negative for sore throat and trouble swallowing.   Gastrointestinal: Negative for vomiting and blood in stool.  All other systems reviewed and are negative.   Allergies  Nsaids  Home Medications   Current Outpatient Rx  Name  Route  Sig  Dispense  Refill  . mesalamine (ASACOL) 400 MG EC tablet   Oral   Take 1 tablet (400 mg total) by mouth daily.         Marland Kitchen omeprazole (PRILOSEC) 20 MG capsule   Oral   Take 2 capsules (40 mg total) by mouth daily.   60 capsule   0   .  ondansetron (ZOFRAN) 4 MG tablet   Oral   Take 2 tablets (8 mg total) by mouth every 8 (eight) hours as needed for nausea.   12 tablet   0   . promethazine (PHENERGAN) 25 MG tablet   Oral   Take 1 tablet (25 mg total) by mouth every 6 (six) hours as needed for nausea.   15 tablet   0    BP 149/91  Pulse 64  Temp(Src) 97.5 F (36.4 C) (Oral)  Resp 20  Ht 6\' 2"  (1.88 m)  Wt 195 lb (88.451 kg)  BMI 25.03 kg/m2  SpO2 96%  Physical Exam  Nursing note and vitals reviewed. Constitutional: He appears well-developed and well-nourished. No distress.  HENT:  Head: Normocephalic and atraumatic.  Mouth/Throat: Oropharynx is clear and moist.  Right lower first molar with decay and fracture. Tender to percussion. Surrounding gingiva is erythematous and tender to palpation. No facial swelling. Oropharynx is clear. No anterior cervical adenopathy. No paratracheal tenderness.   Neck: Neck supple.  Pulmonary/Chest: Effort normal.  Lymphadenopathy:    He has no cervical adenopathy.  Neurological: He is alert.  Skin: He is not diaphoretic.    ED Course  Procedures (including critical care time)  DIAGNOSTIC STUDIES: Oxygen Saturation is 96% on RA, normal by my interpretation.    COORDINATION OF  CARE: 9:51 AM-Discussed treatment plan which includes an antibiotic and pain medication with pt at bedside and pt agreed to plan. Advised pt to follow up with a dentist.   Labs Review Labs Reviewed - No data to display Imaging Review No results found.   EKG Interpretation None      MDM   Final diagnoses:  Dental abscess    Afebrile, nontoxic patient with new dental pain.  Area surrounding broken decaysed tooth is erythematous and reportedly draining purulent material.  Likely abscess.  No red flags. Doubt Ludwig's angina.  D/C home with pain medication and dental follow up.  Discussed findings, treatment, and follow up  with patient.  Pt given return precautions.  Pt verbalizes  understanding and agrees with plan.      I personally performed the services described in this documentation, which was scribed in my presence. The recorded information has been reviewed and is accurate.   Trixie Dredgemily Hriday Stai, PA-C 08/14/13 1013

## 2013-08-15 ENCOUNTER — Encounter (HOSPITAL_COMMUNITY): Payer: Self-pay | Admitting: Emergency Medicine

## 2013-08-15 ENCOUNTER — Emergency Department (HOSPITAL_COMMUNITY)
Admission: EM | Admit: 2013-08-15 | Discharge: 2013-08-15 | Disposition: A | Discharge status: Home / Self Care | Payer: BC Managed Care – PPO | Attending: Emergency Medicine | Admitting: Emergency Medicine

## 2013-08-15 DIAGNOSIS — Z8659 Personal history of other mental and behavioral disorders: Secondary | ICD-10-CM | POA: Insufficient documentation

## 2013-08-15 DIAGNOSIS — K5090 Crohn's disease, unspecified, without complications: Secondary | ICD-10-CM | POA: Insufficient documentation

## 2013-08-15 DIAGNOSIS — R109 Unspecified abdominal pain: Secondary | ICD-10-CM | POA: Insufficient documentation

## 2013-08-15 DIAGNOSIS — K002 Abnormalities of size and form of teeth: Secondary | ICD-10-CM | POA: Insufficient documentation

## 2013-08-15 DIAGNOSIS — Y929 Unspecified place or not applicable: Secondary | ICD-10-CM | POA: Insufficient documentation

## 2013-08-15 DIAGNOSIS — R1110 Vomiting, unspecified: Secondary | ICD-10-CM | POA: Insufficient documentation

## 2013-08-15 DIAGNOSIS — T39311A Poisoning by propionic acid derivatives, accidental (unintentional), initial encounter: Secondary | ICD-10-CM

## 2013-08-15 DIAGNOSIS — T39314A Poisoning by propionic acid derivatives, undetermined, initial encounter: Secondary | ICD-10-CM | POA: Insufficient documentation

## 2013-08-15 DIAGNOSIS — K0889 Other specified disorders of teeth and supporting structures: Secondary | ICD-10-CM

## 2013-08-15 DIAGNOSIS — R195 Other fecal abnormalities: Secondary | ICD-10-CM | POA: Insufficient documentation

## 2013-08-15 DIAGNOSIS — K089 Disorder of teeth and supporting structures, unspecified: Secondary | ICD-10-CM | POA: Insufficient documentation

## 2013-08-15 DIAGNOSIS — Y9389 Activity, other specified: Secondary | ICD-10-CM | POA: Insufficient documentation

## 2013-08-15 DIAGNOSIS — Z79899 Other long term (current) drug therapy: Secondary | ICD-10-CM | POA: Insufficient documentation

## 2013-08-15 DIAGNOSIS — F17200 Nicotine dependence, unspecified, uncomplicated: Secondary | ICD-10-CM | POA: Insufficient documentation

## 2013-08-15 DIAGNOSIS — Z8781 Personal history of (healed) traumatic fracture: Secondary | ICD-10-CM | POA: Insufficient documentation

## 2013-08-15 DIAGNOSIS — Z792 Long term (current) use of antibiotics: Secondary | ICD-10-CM | POA: Insufficient documentation

## 2013-08-15 LAB — CBC WITH DIFFERENTIAL/PLATELET
Basophils Absolute: 0 10*3/uL (ref 0.0–0.1)
Basophils Relative: 0 % (ref 0–1)
EOS ABS: 0.2 10*3/uL (ref 0.0–0.7)
Eosinophils Relative: 2 % (ref 0–5)
HEMATOCRIT: 48.6 % (ref 39.0–52.0)
HEMOGLOBIN: 17.4 g/dL — AB (ref 13.0–17.0)
Lymphocytes Relative: 21 % (ref 12–46)
Lymphs Abs: 2.1 10*3/uL (ref 0.7–4.0)
MCH: 34.3 pg — AB (ref 26.0–34.0)
MCHC: 35.8 g/dL (ref 30.0–36.0)
MCV: 95.7 fL (ref 78.0–100.0)
MONO ABS: 1.4 10*3/uL — AB (ref 0.1–1.0)
MONOS PCT: 14 % — AB (ref 3–12)
NEUTROS PCT: 64 % (ref 43–77)
Neutro Abs: 6.5 10*3/uL (ref 1.7–7.7)
Platelets: 269 10*3/uL (ref 150–400)
RBC: 5.08 MIL/uL (ref 4.22–5.81)
RDW: 12.2 % (ref 11.5–15.5)
WBC: 10.2 10*3/uL (ref 4.0–10.5)

## 2013-08-15 LAB — COMPREHENSIVE METABOLIC PANEL
ALK PHOS: 70 U/L (ref 39–117)
ALT: 27 U/L (ref 0–53)
AST: 17 U/L (ref 0–37)
Albumin: 3.8 g/dL (ref 3.5–5.2)
BILIRUBIN TOTAL: 0.2 mg/dL — AB (ref 0.3–1.2)
BUN: 15 mg/dL (ref 6–23)
CHLORIDE: 98 meq/L (ref 96–112)
CO2: 26 mEq/L (ref 19–32)
Calcium: 9.9 mg/dL (ref 8.4–10.5)
Creatinine, Ser: 1.18 mg/dL (ref 0.50–1.35)
GFR calc non Af Amer: 82 mL/min — ABNORMAL LOW (ref >90)
GLUCOSE: 101 mg/dL — AB (ref 70–99)
Potassium: 4 mEq/L (ref 3.7–5.3)
Sodium: 137 mEq/L (ref 137–147)
TOTAL PROTEIN: 7.6 g/dL (ref 6.0–8.3)

## 2013-08-15 LAB — ETHANOL: Alcohol, Ethyl (B): <11 mg/dL (ref 0–11)

## 2013-08-15 LAB — ACETAMINOPHEN LEVEL

## 2013-08-15 LAB — SALICYLATE LEVEL: Salicylate Lvl: <2 mg/dL — ABNORMAL LOW (ref 2.8–20.0)

## 2013-08-15 LAB — POC OCCULT BLOOD, ED: Fecal Occult Bld: NEGATIVE

## 2013-08-15 MED ORDER — SODIUM CHLORIDE 0.9 % IV BOLUS (SEPSIS)
1000.0000 mL | Freq: Once | INTRAVENOUS | Status: AC
  Administered 2013-08-15: 1000 mL via INTRAVENOUS

## 2013-08-15 MED ORDER — MORPHINE SULFATE 4 MG/ML IJ SOLN
4.0000 mg | Freq: Once | INTRAMUSCULAR | Status: AC
  Administered 2013-08-15: 4 mg via INTRAVENOUS
  Filled 2013-08-15: qty 1

## 2013-08-15 NOTE — ED Notes (Signed)
Bed: WA02 Expected date:  Expected time:  Means of arrival:  Comments: EMS 58M GIB?

## 2013-08-15 NOTE — ED Notes (Signed)
Per EMS report: pt was seen yesterday for his abscess in his mouth.  Pt was given antibiotics.  Pt's has had issues with his abscess x 4 days.  Pt also got a 100 pill bottle of 200mg  ibuprofens 4 days ago and now the bottle is gone. Pt a/o x 4 and ambulatory. Pt's last BM was at noon in which he noted coffee ground like stool.  BM was black in color.

## 2013-08-15 NOTE — ED Notes (Signed)
Pt denies taking pills to harm himself.

## 2013-08-15 NOTE — ED Provider Notes (Signed)
CSN: [id number]     Arrival date & time 08/15/13  1946 History   First MD Initiated Contact with Patient 08/15/13 1948     Chief Complaint  Patient presents with  . Ingestion  . Emesis     (Consider location/radiation/quality/duration/timing/severity/associated sxs/prior Treatment) HPI Comments: Patient presents emergency department with chief complaint of ibuprofen overdose. He states that he has had a dental abscess for the past several days, and has been taking a lot of ibuprofen. He states that since Thursday afternoon, he has taken approximately 3-4 200 mg tablets of ibuprofen every 2 hours, and has sometimes taken up to 6 or 7 tablets at a time. He states that this morning he began to feel abdominal pain, and also had coffee-ground/black stools. He states that he still feels sick, and that his dental pain is moderate to severe. He states that he was not trying to harm himself by taking the medications, but was trying to help his symptoms. He has a past medical history remarkable for Crohn's disease. There are no other aggravating or relieving factors. No other symptoms.  The history is provided by the patient. No language interpreter was used.    Past Medical History  Diagnosis Date  . Crohn's disease   . ADHD (attention deficit hyperactivity disorder)   . Foot fracture, right    History reviewed. No pertinent past surgical history. No family history on file. History  Substance Use Topics  . Smoking status: Current Every Day Smoker -- 1.00 packs/day    Types: Cigarettes  . Smokeless tobacco: Not on file  . Alcohol Use: No    Review of Systems  Constitutional: Negative for fever and chills.  Respiratory: Negative for shortness of breath.   Cardiovascular: Negative for chest pain.  Gastrointestinal: Negative for nausea, vomiting, diarrhea and constipation.  Genitourinary: Negative for dysuria.      Allergies  Nsaids  Home Medications   Current Outpatient Rx  Name   Route  Sig  Dispense  Refill  . HYDROcodone-acetaminophen (NORCO/VICODIN) 5-325 MG per tablet   Oral   Take 1 tablet by mouth every 4 (four) hours as needed.   15 tablet   0   . mesalamine (ASACOL) 400 MG EC tablet   Oral   Take 1 tablet (400 mg total) by mouth daily.         Marland Kitchen omeprazole (PRILOSEC) 20 MG capsule   Oral   Take 2 capsules (40 mg total) by mouth daily.   60 capsule   0   . ondansetron (ZOFRAN) 4 MG tablet   Oral   Take 2 tablets (8 mg total) by mouth every 8 (eight) hours as needed for nausea.   12 tablet   0   . penicillin v potassium (VEETID) 500 MG tablet   Oral   Take 1 tablet (500 mg total) by mouth 4 (four) times daily.   40 tablet   0   . promethazine (PHENERGAN) 25 MG tablet   Oral   Take 1 tablet (25 mg total) by mouth every 6 (six) hours as needed for nausea.   15 tablet   0    BP 136/94  Pulse 107  Temp(Src) 98.2 F (36.8 C) (Oral)  Resp 18  SpO2 99% Physical Exam  Nursing note and vitals reviewed. Constitutional: He is oriented to person, place, and time. He appears well-developed and well-nourished.  HENT:  Head: Normocephalic and atraumatic.  Mouth/Throat:    Poor dentition throughout.  Affected tooth  as diagrammed.  No signs of peritonsillar or tonsillar abscess.  No signs of gingival abscess. Oropharynx is clear and without exudates.  Uvula is midline.  Airway is intact. No signs of Ludwig's angina with palpation of oral and sublingual mucosa.   Eyes: Conjunctivae and EOM are normal. Pupils are equal, round, and reactive to light. Right eye exhibits no discharge. Left eye exhibits no discharge. No scleral icterus.  Neck: Normal range of motion. Neck supple. No JVD present.  Cardiovascular: Normal rate, regular rhythm and normal heart sounds.  Exam reveals no gallop and no friction rub.   No murmur heard. Pulmonary/Chest: Effort normal and breath sounds normal. No respiratory distress. He has no wheezes. He has no rales. He  exhibits no tenderness.  Abdominal: Soft. He exhibits no distension and no mass. There is no tenderness. There is no rebound and no guarding.  Genitourinary:  No hemorrhoid, no gross blood, soft brown stool, normal rectal exam  Musculoskeletal: Normal range of motion. He exhibits no edema and no tenderness.  Neurological: He is alert and oriented to person, place, and time.  Skin: Skin is warm and dry.  Psychiatric: He has a normal mood and affect. His behavior is normal. Judgment and thought content normal.    ED Course  Procedures (including critical care time) Results for orders placed during the hospital encounter of 08/15/13  SALICYLATE LEVEL      Result Value Ref Range   Salicylate Lvl <2.0 (*) 2.8 - 20.0 mg/dL  CBC WITH DIFFERENTIAL      Result Value Ref Range   WBC 10.2  4.0 - 10.5 K/uL   RBC 5.08  4.22 - 5.81 MIL/uL   Hemoglobin 17.4 (*) 13.0 - 17.0 g/dL   HCT 16.1  09.6 - 04.5 %   MCV 95.7  78.0 - 100.0 fL   MCH 34.3 (*) 26.0 - 34.0 pg   MCHC 35.8  30.0 - 36.0 g/dL   RDW 40.9  81.1 - 91.4 %   Platelets 269  150 - 400 K/uL   Neutrophils Relative % 64  43 - 77 %   Neutro Abs 6.5  1.7 - 7.7 K/uL   Lymphocytes Relative 21  12 - 46 %   Lymphs Abs 2.1  0.7 - 4.0 K/uL   Monocytes Relative 14 (*) 3 - 12 %   Monocytes Absolute 1.4 (*) 0.1 - 1.0 K/uL   Eosinophils Relative 2  0 - 5 %   Eosinophils Absolute 0.2  0.0 - 0.7 K/uL   Basophils Relative 0  0 - 1 %   Basophils Absolute 0.0  0.0 - 0.1 K/uL  COMPREHENSIVE METABOLIC PANEL      Result Value Ref Range   Sodium 137  137 - 147 mEq/L   Potassium 4.0  3.7 - 5.3 mEq/L   Chloride 98  96 - 112 mEq/L   CO2 26  19 - 32 mEq/L   Glucose, Bld 101 (*) 70 - 99 mg/dL   BUN 15  6 - 23 mg/dL   Creatinine, Ser 7.82  0.50 - 1.35 mg/dL   Calcium 9.9  8.4 - 95.6 mg/dL   Total Protein 7.6  6.0 - 8.3 g/dL   Albumin 3.8  3.5 - 5.2 g/dL   AST 17  0 - 37 U/L   ALT 27  0 - 53 U/L   Alkaline Phosphatase 70  39 - 117 U/L   Total Bilirubin  0.2 (*) 0.3 - 1.2 mg/dL   GFR  calc non Af Amer 82 (*) >90 mL/min   GFR calc Af Amer >90  >90 mL/min  ACETAMINOPHEN LEVEL      Result Value Ref Range   Acetaminophen (Tylenol), Serum <15.0  10 - 30 ug/mL  ETHANOL      Result Value Ref Range   Alcohol, Ethyl (B) <11  0 - 11 mg/dL  POC OCCULT BLOOD, ED      Result Value Ref Range   Fecal Occult Bld NEGATIVE  NEGATIVE   No results found.    EKG Interpretation None      MDM   Final diagnoses:  Ibuprofen overdose  Pain, dental   Patient with ibuprofen overdose and dental pain.  Patient discussed with Dr. Silverio LayYao, who recommends consulting Poison Control.  Will check labs, FOBT, and reassess.   8:13 PM Discussed with Onalee Huaavid from [employer], who recommends checking labs and reassessing.  If labs are normal, then patient can go home.  Very clearly asked the patient about intent to harm himself, this was not the case at all.  He was taking the pills for pain control only.  He is not suicidal.  9:28 PM Patient states he feels better. His labs are unremarkable. Advised to discontinue excessive use of ibuprofen. Followup with dentist on Wednesday. Patient understands and agrees with the plan. He is stable and ready for discharge.  Prior notes reviewed.   Roxy Horsemanobert Karna Abed, PA-C 08/15/13 2129

## 2013-08-15 NOTE — Discharge Instructions (Signed)

## 2013-08-18 NOTE — ED Provider Notes (Signed)
Medical screening examination/treatment/procedure(s) were performed by non-physician practitioner and as supervising physician I was immediately available for consultation/collaboration.   EKG Interpretation None        Richardean Canal, MD 08/18/13 1626

## 2013-09-05 ENCOUNTER — Emergency Department (HOSPITAL_COMMUNITY): Payer: BC Managed Care – PPO

## 2013-09-05 ENCOUNTER — Emergency Department (HOSPITAL_COMMUNITY)
Admission: EM | Admit: 2013-09-05 | Discharge: 2013-09-05 | Disposition: A | Discharge status: Home / Self Care | Payer: BC Managed Care – PPO | Attending: Emergency Medicine | Admitting: Emergency Medicine

## 2013-09-05 ENCOUNTER — Encounter (HOSPITAL_COMMUNITY): Payer: Self-pay | Admitting: Emergency Medicine

## 2013-09-05 DIAGNOSIS — R6883 Chills (without fever): Secondary | ICD-10-CM | POA: Insufficient documentation

## 2013-09-05 DIAGNOSIS — K5090 Crohn's disease, unspecified, without complications: Secondary | ICD-10-CM | POA: Insufficient documentation

## 2013-09-05 DIAGNOSIS — R112 Nausea with vomiting, unspecified: Secondary | ICD-10-CM | POA: Insufficient documentation

## 2013-09-05 DIAGNOSIS — F909 Attention-deficit hyperactivity disorder, unspecified type: Secondary | ICD-10-CM | POA: Insufficient documentation

## 2013-09-05 DIAGNOSIS — R109 Unspecified abdominal pain: Secondary | ICD-10-CM | POA: Insufficient documentation

## 2013-09-05 DIAGNOSIS — F17200 Nicotine dependence, unspecified, uncomplicated: Secondary | ICD-10-CM | POA: Insufficient documentation

## 2013-09-05 DIAGNOSIS — Z79899 Other long term (current) drug therapy: Secondary | ICD-10-CM | POA: Insufficient documentation

## 2013-09-05 LAB — URINALYSIS, ROUTINE W REFLEX MICROSCOPIC
Bilirubin Urine: NEGATIVE
Glucose, UA: NEGATIVE mg/dL
HGB URINE DIPSTICK: NEGATIVE
Ketones, ur: NEGATIVE mg/dL
Leukocytes, UA: NEGATIVE
Nitrite: NEGATIVE
Protein, ur: NEGATIVE mg/dL
SPECIFIC GRAVITY, URINE: 1.008 (ref 1.005–1.030)
UROBILINOGEN UA: 0.2 mg/dL (ref 0.0–1.0)
pH: 7.5 (ref 5.0–8.0)

## 2013-09-05 LAB — COMPREHENSIVE METABOLIC PANEL
ALT: 22 U/L (ref 0–53)
AST: 14 U/L (ref 0–37)
Albumin: 4.1 g/dL (ref 3.5–5.2)
Alkaline Phosphatase: 67 U/L (ref 39–117)
BUN: 8 mg/dL (ref 6–23)
CO2: 26 mEq/L (ref 19–32)
Calcium: 9.9 mg/dL (ref 8.4–10.5)
Chloride: 102 mEq/L (ref 96–112)
Creatinine, Ser: 0.83 mg/dL (ref 0.50–1.35)
GFR calc Af Amer: >90 mL/min (ref >90)
GFR calc non Af Amer: >90 mL/min (ref >90)
Glucose, Bld: 99 mg/dL (ref 70–99)
Potassium: 4.1 mEq/L (ref 3.7–5.3)
SODIUM: 140 meq/L (ref 137–147)
TOTAL PROTEIN: 7.1 g/dL (ref 6.0–8.3)
Total Bilirubin: 0.8 mg/dL (ref 0.3–1.2)

## 2013-09-05 LAB — CBC WITH DIFFERENTIAL/PLATELET
BASOS PCT: 0 % (ref 0–1)
Basophils Absolute: 0 10*3/uL (ref 0.0–0.1)
Eosinophils Absolute: 0.1 10*3/uL (ref 0.0–0.7)
Eosinophils Relative: 1 % (ref 0–5)
HCT: 46 % (ref 39.0–52.0)
Hemoglobin: 15.7 g/dL (ref 13.0–17.0)
Lymphocytes Relative: 15 % (ref 12–46)
Lymphs Abs: 2.3 10*3/uL (ref 0.7–4.0)
MCH: 33.1 pg (ref 26.0–34.0)
MCHC: 34.1 g/dL (ref 30.0–36.0)
MCV: 96.8 fL (ref 78.0–100.0)
Monocytes Absolute: 1.2 10*3/uL — ABNORMAL HIGH (ref 0.1–1.0)
Monocytes Relative: 8 % (ref 3–12)
Neutro Abs: 11.3 10*3/uL — ABNORMAL HIGH (ref 1.7–7.7)
Neutrophils Relative %: 76 % (ref 43–77)
Platelets: 277 10*3/uL (ref 150–400)
RBC: 4.75 MIL/uL (ref 4.22–5.81)
RDW: 12.8 % (ref 11.5–15.5)
WBC: 14.8 10*3/uL — ABNORMAL HIGH (ref 4.0–10.5)

## 2013-09-05 LAB — LIPASE, BLOOD: Lipase: 14 U/L (ref 11–59)

## 2013-09-05 MED ORDER — METHYLPREDNISOLONE SODIUM SUCC 125 MG IJ SOLR
125.0000 mg | Freq: Once | INTRAMUSCULAR | Status: AC
  Administered 2013-09-05: 125 mg via INTRAVENOUS
  Filled 2013-09-05: qty 2

## 2013-09-05 MED ORDER — HYDROMORPHONE HCL PF 1 MG/ML IJ SOLN
1.0000 mg | Freq: Once | INTRAMUSCULAR | Status: AC
  Administered 2013-09-05: 1 mg via INTRAVENOUS
  Filled 2013-09-05: qty 1

## 2013-09-05 MED ORDER — MORPHINE SULFATE 4 MG/ML IJ SOLN
4.0000 mg | Freq: Once | INTRAMUSCULAR | Status: AC
  Administered 2013-09-05: 4 mg via INTRAVENOUS
  Filled 2013-09-05: qty 1

## 2013-09-05 MED ORDER — MESALAMINE 400 MG PO TBEC: 400.0000 mg | DELAYED_RELEASE_TABLET | Freq: Every day | ORAL | Status: DC

## 2013-09-05 MED ORDER — IOHEXOL 300 MG/ML  SOLN
100.0000 mL | Freq: Once | INTRAMUSCULAR | Status: AC | PRN
  Administered 2013-09-05: 100 mL via INTRAVENOUS

## 2013-09-05 MED ORDER — IOHEXOL 300 MG/ML  SOLN
50.0000 mL | Freq: Once | INTRAMUSCULAR | Status: AC | PRN
  Administered 2013-09-05: 50 mL via ORAL

## 2013-09-05 MED ORDER — SODIUM CHLORIDE 0.9 % IV BOLUS (SEPSIS)
1000.0000 mL | Freq: Once | INTRAVENOUS | Status: AC
  Administered 2013-09-05: 1000 mL via INTRAVENOUS

## 2013-09-05 MED ORDER — ONDANSETRON HCL 4 MG/2ML IJ SOLN
4.0000 mg | Freq: Once | INTRAMUSCULAR | Status: AC
  Administered 2013-09-05: 4 mg via INTRAVENOUS
  Filled 2013-09-05: qty 2

## 2013-09-05 MED ORDER — SODIUM CHLORIDE 0.9 % IV SOLN
INTRAVENOUS | Status: DC
  Administered 2013-09-05: 17:00:00 via INTRAVENOUS

## 2013-09-05 NOTE — ED Notes (Signed)
Pt states he is unable to void at this time.

## 2013-09-05 NOTE — ED Provider Notes (Signed)
CSN: [id number]     Arrival date & time 09/05/13  1456 History   First MD Initiated Contact with Patient 09/05/13 1511     Chief Complaint  Patient presents with  . Abdominal Pain     (Consider location/radiation/quality/duration/timing/severity/associated sxs/prior Treatment) Patient is a 28 y.o. male presenting with abdominal pain. The history is provided by the patient and a parent.  Abdominal Pain Pain location:  RUQ Pain quality: sharp   Pain radiates to:  Epigastric region Pain severity:  Moderate Onset quality:  Sudden Duration:  1 day Timing:  Intermittent Progression:  Waxing and waning Chronicity:  Recurrent Context: not alcohol use   Relieved by:  Nothing Worsened by:  Nothing tried Ineffective treatments:  None tried Associated symptoms: chills, nausea and vomiting   Associated symptoms: no constipation, no diarrhea, no fever and no melena   h/o chron's dz and this is similar--out of his asacol x 4 days--denies urinary sx--  Past Medical History  Diagnosis Date  . Crohn's disease   . ADHD (attention deficit hyperactivity disorder)   . Foot fracture, right    No past surgical history on file. No family history on file. History  Substance Use Topics  . Smoking status: Current Every Day Smoker -- 1.00 packs/day    Types: Cigarettes  . Smokeless tobacco: Not on file  . Alcohol Use: No    Review of Systems  Constitutional: Positive for chills. Negative for fever.  Gastrointestinal: Positive for nausea, vomiting and abdominal pain. Negative for diarrhea, constipation and melena.  All other systems reviewed and are negative.      Allergies  Nsaids  Home Medications   Current Outpatient Rx  Name  Route  Sig  Dispense  Refill  . mesalamine (ASACOL) 400 MG EC tablet   Oral   Take 1 tablet (400 mg total) by mouth daily.          BP 154/96  Pulse 82  Temp(Src) 98 F (36.7 C) (Oral)  Resp 14  SpO2 100% Physical Exam  Nursing note and vitals  reviewed. Constitutional: He is oriented to person, place, and time. He appears well-developed and well-nourished.  Non-toxic appearance. No distress.  HENT:  Head: Normocephalic and atraumatic.  Eyes: Conjunctivae, EOM and lids are normal. Pupils are equal, round, and reactive to light.  Neck: Normal range of motion. Neck supple. No tracheal deviation present. No mass present.  Cardiovascular: Normal rate, regular rhythm and normal heart sounds.  Exam reveals no gallop.   No murmur heard. Pulmonary/Chest: Effort normal and breath sounds normal. No stridor. No respiratory distress. He has no decreased breath sounds. He has no wheezes. He has no rhonchi. He has no rales.  Abdominal: Soft. Normal appearance and bowel sounds are normal. He exhibits no distension. There is tenderness in the right upper quadrant. There is no rigidity, no rebound, no guarding and no CVA tenderness.    Musculoskeletal: Normal range of motion. He exhibits no edema and no tenderness.  Neurological: He is alert and oriented to person, place, and time. He has normal strength. No cranial nerve deficit or sensory deficit. GCS eye subscore is 4. GCS verbal subscore is 5. GCS motor subscore is 6.  Skin: Skin is warm and dry. No abrasion and no rash noted.  Psychiatric: He has a normal mood and affect. His speech is normal and behavior is normal.    ED Course  Procedures (including critical care time) Labs Review Labs Reviewed  CBC WITH DIFFERENTIAL -  Abnormal; Notable for the following:    WBC 14.8 (*)    Neutro Abs 11.3 (*)    Monocytes Absolute 1.2 (*)    All other components within normal limits  COMPREHENSIVE METABOLIC PANEL  LIPASE, BLOOD  URINALYSIS, ROUTINE W REFLEX MICROSCOPIC   Imaging Review No results found.   EKG Interpretation None      MDM   Final diagnoses:  None    Patient given IV fluids and pain medication along with Solu-Medrol and feels better. Work up here appears to be reassuring.  He will follow up with his gastroenterologist this and I will place him back on Asacol.    Toy BakerAnthony T Jenika Chiem, MD 09/05/13 Ernestina [id number]

## 2013-09-05 NOTE — ED Notes (Signed)
Pt reports n/v that began yesterday evening and RUQ pain - pt admits to hx of crohns disease, denies any diarrhea or fever.

## 2013-09-05 NOTE — ED Notes (Signed)
Pt states hx of crohn's.  States that he has been having RUQ abd pain x 2 wks.  Was here on 08/15/13.  Has been vomiting since last night.  Denies diarrhea.

## 2013-09-05 NOTE — Discharge Instructions (Signed)
Crohn's Disease Crohn's disease is a long-term (chronic) soreness and redness (inflammation) of the intestines (bowel). It can affect any portion of the digestive tract, from the mouth to the anus. It can also cause problems outside the digestive tract. Crohn's disease is closely related to a disease called ulcerative colitis (together, these two diseases are called inflammatory bowel disease).  CAUSES  The cause of Crohn's disease is not known. One theory is that, in an easily affected person, the immune system is triggered to attack the body's own digestive tissue. Crohn's disease runs in families. It seems to be more common in certain geographic areas and amongst certain races. There are no clear-cut dietary causes.  SYMPTOMS  Crohn's disease can cause many different symptoms since it can affect many different parts of the body. Symptoms include:  Fatigue.  Weight loss.  Chronic diarrhea, sometime bloody.  Abdominal pain and cramps.  Fever.  Ulcers or canker sores in the mouth or rectum.  Anemia (low red blood cells).  Arthritis, skin problems, and eye problems may occur. Complications of Crohn's disease can include:  Series of holes (perforation) of the bowel.  Portions of the intestines sticking to each other (adhesions).  Obstruction of the bowel.  Fistula formation, typically in the rectal area but also in other areas. A fistula is an opening between the bowels and the outside, or between the bowels and another organ.  A painful crack in the mucous membrane of the anus (rectal fissure). DIAGNOSIS  Your caregiver may suspect Crohn's disease based on your symptoms and an exam. Blood tests may confirm that there is a problem. You may be asked to submit a stool specimen for examination. X-rays and CT scans may be necessary. Ultimately, the diagnosis is usually made after a procedure that uses a flexible tube that is inserted via your mouth or your anus. This is done under  sedation and is called either an upper endoscopy or colonoscopy. With these tests, the specialist can take tiny tissue samples and remove them from the inside of the bowel (biopsy). Examination of this biopsy tissue under a microscope can reveal Crohn's disease as the cause of your symptoms. Due to the many different forms that Crohn's disease can take, symptoms may be present for several years before a diagnosis is made. TREATMENT  Medications are often used to decrease inflammation and control the immune system. These include medicines related to aspirin, steroid medications, and newer and stronger medications to slow down the immune system. Some medications may be used as suppositories or enemas. A number of other medications are used or have been studied. Your caregiver will make specific recommendations. HOME CARE INSTRUCTIONS   Symptoms such as diarrhea can be controlled with medications. Avoid foods that have a laxative effect such as fresh fruit, vegetables and dairy products. During flare ups, you can rest your bowel by refraining from solid foods. Drink clear liquids frequently during the day (electrolyte or re-hydrating fluids are best. Your caregiver can help you with suggestions). Drink often to prevent loss of body fluids (dehydration). When diarrhea has cleared, eat small meals and more frequently. Avoid food additives and stimulants such as caffeine (coffee, tea, or chocolate). Enzyme supplements may help if you develop intolerance to a sugar in dairy products (lactose). Ask your caregiver or dietitian about specific dietary instructions.  Try to maintain a positive attitude. Learn relaxation techniques such as self hypnosis, mental imaging, and muscle relaxation.  If possible, avoid stresses which can aggravate your condition.    Exercise regularly.  Follow your diet.  Always get plenty of rest. SEEK MEDICAL CARE IF:   Your symptoms fail to improve after a week or two of new  treatment.  You experience continued weight loss.  You have ongoing cramps or loose bowels.  You develop a new skin rash, skin sores, or eye problems. SEEK IMMEDIATE MEDICAL CARE IF:   You have worsening of your symptoms or develop new symptoms.  You have a fever.  You develop bloody diarrhea.  You develop severe abdominal pain. MAKE SURE YOU:   Understand these instructions.  Will watch your condition.  Will get help right away if you are not doing well or get worse. Document Released: 02/27/2005 Document Revised: 09/14/2012 Document Reviewed: 01/26/2007 [employer] Patient Information 2014 [employer].  

## 2013-09-06 ENCOUNTER — Telehealth (HOSPITAL_BASED_OUTPATIENT_CLINIC_OR_DEPARTMENT_OTHER): Payer: Self-pay

## 2013-09-06 NOTE — Telephone Encounter (Signed)
Pt calling for GI MD referred to from ED visit.  Per MD noted pt to f/u with his GI MD.  Pt reports goes to [facility] for speciality MD's and cant get appt for 3 months.

## 2013-09-07 ENCOUNTER — Encounter: Payer: Self-pay | Admitting: Internal Medicine

## 2013-09-20 ENCOUNTER — Emergency Department (HOSPITAL_COMMUNITY)
Admission: EM | Admit: 2013-09-20 | Discharge: 2013-09-21 | Disposition: A | Discharge status: Home / Self Care | Payer: BC Managed Care – PPO | Attending: Emergency Medicine | Admitting: Emergency Medicine

## 2013-09-20 ENCOUNTER — Emergency Department (HOSPITAL_COMMUNITY): Payer: BC Managed Care – PPO

## 2013-09-20 ENCOUNTER — Encounter (HOSPITAL_COMMUNITY): Payer: Self-pay | Admitting: Emergency Medicine

## 2013-09-20 DIAGNOSIS — G8918 Other acute postprocedural pain: Secondary | ICD-10-CM

## 2013-09-20 DIAGNOSIS — R109 Unspecified abdominal pain: Secondary | ICD-10-CM | POA: Insufficient documentation

## 2013-09-20 DIAGNOSIS — F17200 Nicotine dependence, unspecified, uncomplicated: Secondary | ICD-10-CM | POA: Insufficient documentation

## 2013-09-20 DIAGNOSIS — R000 Tachycardia, unspecified: Secondary | ICD-10-CM | POA: Insufficient documentation

## 2013-09-20 DIAGNOSIS — R1033 Periumbilical pain: Secondary | ICD-10-CM | POA: Insufficient documentation

## 2013-09-20 DIAGNOSIS — R110 Nausea: Secondary | ICD-10-CM | POA: Insufficient documentation

## 2013-09-20 DIAGNOSIS — Z8781 Personal history of (healed) traumatic fracture: Secondary | ICD-10-CM | POA: Insufficient documentation

## 2013-09-20 DIAGNOSIS — Z9089 Acquired absence of other organs: Secondary | ICD-10-CM | POA: Insufficient documentation

## 2013-09-20 DIAGNOSIS — R1013 Epigastric pain: Secondary | ICD-10-CM | POA: Insufficient documentation

## 2013-09-20 DIAGNOSIS — Z79899 Other long term (current) drug therapy: Secondary | ICD-10-CM | POA: Insufficient documentation

## 2013-09-20 DIAGNOSIS — F909 Attention-deficit hyperactivity disorder, unspecified type: Secondary | ICD-10-CM | POA: Insufficient documentation

## 2013-09-20 DIAGNOSIS — Z8719 Personal history of other diseases of the digestive system: Secondary | ICD-10-CM | POA: Insufficient documentation

## 2013-09-20 LAB — COMPREHENSIVE METABOLIC PANEL
ALT: 27 U/L (ref 0–53)
AST: 14 U/L (ref 0–37)
Albumin: 3.7 g/dL (ref 3.5–5.2)
Alkaline Phosphatase: 55 U/L (ref 39–117)
BILIRUBIN TOTAL: 0.2 mg/dL — AB (ref 0.3–1.2)
BUN: 14 mg/dL (ref 6–23)
CHLORIDE: 101 meq/L (ref 96–112)
CO2: 30 meq/L (ref 19–32)
Calcium: 9.6 mg/dL (ref 8.4–10.5)
Creatinine, Ser: 0.99 mg/dL (ref 0.50–1.35)
GLUCOSE: 82 mg/dL (ref 70–99)
POTASSIUM: 3.7 meq/L (ref 3.7–5.3)
SODIUM: 142 meq/L (ref 137–147)
Total Protein: 6.6 g/dL (ref 6.0–8.3)

## 2013-09-20 LAB — POC OCCULT BLOOD, ED: Fecal Occult Bld: NEGATIVE

## 2013-09-20 LAB — URINALYSIS, ROUTINE W REFLEX MICROSCOPIC
Bilirubin Urine: NEGATIVE
Glucose, UA: NEGATIVE mg/dL
HGB URINE DIPSTICK: NEGATIVE
Ketones, ur: NEGATIVE mg/dL
Leukocytes, UA: NEGATIVE
Nitrite: NEGATIVE
PROTEIN: NEGATIVE mg/dL
Specific Gravity, Urine: 1.023 (ref 1.005–1.030)
UROBILINOGEN UA: 0.2 mg/dL (ref 0.0–1.0)
pH: 6 (ref 5.0–8.0)

## 2013-09-20 LAB — CBC WITH DIFFERENTIAL/PLATELET
Basophils Absolute: 0 10*3/uL (ref 0.0–0.1)
Basophils Relative: 0 % (ref 0–1)
Eosinophils Absolute: 0.2 10*3/uL (ref 0.0–0.7)
Eosinophils Relative: 1 % (ref 0–5)
HCT: 47.1 % (ref 39.0–52.0)
Hemoglobin: 16.2 g/dL (ref 13.0–17.0)
LYMPHS ABS: 3.4 10*3/uL (ref 0.7–4.0)
LYMPHS PCT: 31 % (ref 12–46)
MCH: 33.1 pg (ref 26.0–34.0)
MCHC: 34.4 g/dL (ref 30.0–36.0)
MCV: 96.3 fL (ref 78.0–100.0)
Monocytes Absolute: 1.2 10*3/uL — ABNORMAL HIGH (ref 0.1–1.0)
Monocytes Relative: 11 % (ref 3–12)
NEUTROS ABS: 6.4 10*3/uL (ref 1.7–7.7)
NEUTROS PCT: 57 % (ref 43–77)
PLATELETS: 260 10*3/uL (ref 150–400)
RBC: 4.89 MIL/uL (ref 4.22–5.81)
RDW: 12.6 % (ref 11.5–15.5)
WBC: 11.1 10*3/uL — AB (ref 4.0–10.5)

## 2013-09-20 LAB — I-STAT CG4 LACTIC ACID, ED: Lactic Acid, Venous: 1.24 mmol/L (ref 0.5–2.2)

## 2013-09-20 MED ORDER — MORPHINE SULFATE 4 MG/ML IJ SOLN
4.0000 mg | Freq: Once | INTRAMUSCULAR | Status: AC
  Administered 2013-09-20: 4 mg via INTRAVENOUS
  Filled 2013-09-20: qty 1

## 2013-09-20 MED ORDER — OXYCODONE HCL 5 MG PO TABS: 5.0000 mg | ORAL_TABLET | ORAL | Status: DC | PRN

## 2013-09-20 MED ORDER — DIPHENHYDRAMINE HCL 50 MG/ML IJ SOLN
25.0000 mg | Freq: Once | INTRAMUSCULAR | Status: AC
  Administered 2013-09-20: 25 mg via INTRAVENOUS
  Filled 2013-09-20: qty 1

## 2013-09-20 MED ORDER — ONDANSETRON HCL 4 MG/2ML IJ SOLN
4.0000 mg | Freq: Once | INTRAMUSCULAR | Status: AC
Start: 2013-09-20 — End: 2013-09-20
  Administered 2013-09-20: 4 mg via INTRAVENOUS
  Filled 2013-09-20: qty 2

## 2013-09-20 MED ORDER — ONDANSETRON 8 MG PO TBDP: ORAL_TABLET | ORAL | Status: DC

## 2013-09-20 MED ORDER — SODIUM CHLORIDE 0.9 % IV BOLUS (SEPSIS)
1000.0000 mL | Freq: Once | INTRAVENOUS | Status: AC
  Administered 2013-09-20: 1000 mL via INTRAVENOUS

## 2013-09-20 NOTE — Discharge Instructions (Signed)
1. Medications: roxicodone as needed for breakthrough pain, usual home medications 2. Treatment: rest, drink plenty of fluids,  3. Follow Up: [facility] and further evaluation after today's visit; return to the ED for worsening symptoms or persistent vomiting   Nausea, Adult Nausea is the feeling that you have an upset stomach or have to vomit. Nausea by itself is not likely a serious concern, but it may be an early sign of more serious medical problems. As nausea gets worse, it can lead to vomiting. If vomiting develops, there is the risk of dehydration.  CAUSES   Viral infections.  Food poisoning.  Medicines.  Pregnancy.  Motion sickness.  Migraine headaches.  Emotional distress.  Severe pain from any source.  Alcohol intoxication. HOME CARE INSTRUCTIONS  Get plenty of rest.  Ask your caregiver about specific rehydration instructions.  Eat small amounts of food and sip liquids more often.  Take all medicines as told by your caregiver. SEEK MEDICAL CARE IF:  You have not improved after 2 days, or you get worse.  You have a headache. SEEK IMMEDIATE MEDICAL CARE IF:   You have a fever.  You faint.  You keep vomiting or have blood in your vomit.  You are extremely weak or dehydrated.  You have dark or bloody stools.  You have severe chest or abdominal pain. MAKE SURE YOU:  Understand these instructions.  Will watch your condition.  Will get help right away if you are not doing well or get worse. Document Released: 06/27/2004 Document Revised: 02/12/2012 Document Reviewed: 01/30/2011 [facility] Patient Information 2014 [place], [place].

## 2013-09-20 NOTE — ED Provider Notes (Signed)
CSN: [id number]     Arrival date & time 09/20/13  1800 History   First MD Initiated Contact with Patient 09/20/13 1932     Chief Complaint  Patient presents with  . Abdominal Pain     (Consider location/radiation/quality/duration/timing/severity/associated sxs/prior Treatment) The history is provided by the patient and medical records. No language interpreter was used.    Lurena NidaJustin M Kersten is a 28 y.o. male  with a hx of Crohn's, ADHD, [facility] presents to the Emergency Department complaining of gradual, persistent, progressively worsening periumbilical pain pain described as sharp and tearing onset this AM around 4:45am waking him from sleep.  Pt reports he was healing well and only taking his pain meds every 8+ hours, but has had to take it more often today.  He reports associated nausea, but the phenergan has helped significantly. Pt denies vomiting, fever, chills, headache, neck pain, chest pain, SOB, diarrhea, weakness, dizziness, syncope.  Pt reports movement and walking makes the pain significantly worse.  Pt also reports formed BMs with BRBPR. He reports that it tints the water, it seen on the TP and mixed into the stool.  Pt reports a Hx of Chron's without ever having bloody stools.    Past Medical History  Diagnosis Date  . Crohn's disease   . ADHD (attention deficit hyperactivity disorder)   . Foot fracture, right    Past Surgical History  Procedure Laterality Date  . Gallbladder surgery     No family history on file. History  Substance Use Topics  . Smoking status: Current Every Day Smoker -- 1.00 packs/day    Types: Cigarettes  . Smokeless tobacco: Not on file  . Alcohol Use: No    Review of Systems  Constitutional: Negative for fever, diaphoresis, appetite change, fatigue and unexpected weight change.  HENT: Negative for mouth sores and trouble swallowing.   Respiratory: Negative for cough, chest tightness, shortness  of breath, wheezing and stridor.   Cardiovascular: Negative for chest pain and palpitations.  Gastrointestinal: Positive for nausea, abdominal pain and blood in stool. Negative for vomiting, diarrhea, constipation, abdominal distention and rectal pain.  Genitourinary: Negative for dysuria, urgency, frequency, hematuria, flank pain and difficulty urinating.  Musculoskeletal: Negative for back pain, neck pain and neck stiffness.  Skin: Negative for rash.  Neurological: Negative for weakness.  Hematological: Negative for adenopathy.  Psychiatric/Behavioral: Negative for confusion.  All other systems reviewed and are negative.     Allergies  Ivp dye and Nsaids  Home Medications   Prior to Admission medications   Medication Sig Start Date End Date Taking? Authorizing Provider  HYDROcodone-acetaminophen (NORCO) 10-325 MG per tablet Take 1 tablet by mouth every 6 (six) hours as needed (pain.).   Yes Historical Provider, MD  Mesalamine (DELZICOL) 400 MG CPDR DR capsule Take 400 mg by mouth 2 (two) times daily.   Yes Historical Provider, MD  promethazine (PHENERGAN) 25 MG tablet Take 25 mg by mouth every 6 (six) hours as needed for nausea or vomiting.   Yes Historical Provider, MD   BP 124/82  Pulse 73  Temp(Src) 97.8 F (36.6 C) (Oral)  Resp 19  Ht 6\' 2"  (1.88 m)  Wt 185 lb (83.915 kg)  BMI 23.74 kg/m2  SpO2 97% Physical Exam  Nursing note and vitals reviewed. Constitutional: He is oriented to person, place, and time. He appears well-developed and well-nourished. No distress.  Awake, alert, nontoxic appearance  HENT:  Head: Normocephalic and atraumatic.  Mouth/Throat:  Oropharynx is clear and moist. No oropharyngeal exudate.  Eyes: Conjunctivae are normal. No scleral icterus.  Neck: Normal range of motion. Neck supple.  Cardiovascular: Regular rhythm, normal heart sounds and intact distal pulses.   No murmur heard. Mild tachycardia  Pulmonary/Chest: Effort normal and breath  sounds normal. No respiratory distress. He has no wheezes.  Clear and equal breath sounds  Abdominal: Soft. Bowel sounds are normal. He exhibits no distension and no mass. There is tenderness in the epigastric area, periumbilical area and suprapubic area. There is guarding. There is no rebound and no CVA tenderness.  Abdomen soft with significant tenderness in the epigastrium and periumbilical region.  Well-healing laparoscopic incisions with resolving ecchymosis periumbilically - no erythema or purulent drainage  Pt with guarding on exam  Musculoskeletal: Normal range of motion. He exhibits no edema.  Lymphadenopathy:    He has no cervical adenopathy.  Neurological: He is alert and oriented to person, place, and time. He exhibits normal muscle tone. Coordination normal.  Speech is clear and goal oriented Moves extremities without ataxia  Skin: Skin is warm and dry. He is not diaphoretic. No erythema.  Psychiatric: He has a normal mood and affect.    ED Course  Procedures (including critical care time) Labs Review Labs Reviewed  CBC WITH DIFFERENTIAL - Abnormal; Notable for the following:    WBC 11.1 (*)    Monocytes Absolute 1.2 (*)    All other components within normal limits  COMPREHENSIVE METABOLIC PANEL - Abnormal; Notable for the following:    Total Bilirubin 0.2 (*)    All other components within normal limits  URINALYSIS, ROUTINE W REFLEX MICROSCOPIC  I-STAT CG4 LACTIC ACID, ED  POC OCCULT BLOOD, ED    Imaging Review Ct Abdomen Pelvis Wo Contrast  09/20/2013   CLINICAL DATA:  Abdominal pain. History of Crohn's. IV contrast material not given due to contrast allergy.  EXAM: CT ABDOMEN AND PELVIS WITHOUT CONTRAST  TECHNIQUE: Multidetector CT imaging of the abdomen and pelvis was performed following the standard protocol without intravenous contrast.  COMPARISON:  CT ABD/PELVIS W CM dated 09/05/2013; CT ABD/PELVIS W CM dated 02/28/2012  FINDINGS: Atelectasis or focal infiltration  in the lung bases. Surgical absence of the gallbladder. The unenhanced appearance of the liver, spleen, pancreas, adrenal glands, kidneys, abdominal aorta, inferior vena cava, and retroperitoneal lymph nodes is unremarkable the stomach, small bowel, and colon are not abnormally distended and no focal wall thickening is appreciated, given the level of distention available. No free air or free fluid in the abdomen.  Pelvis: The appendix is normal. The bladder wall is not thickened. Prostate gland is not enlarged. No pelvic mass or lymphadenopathy. No or destructive bone lesions.  IMPRESSION: No acute process demonstrated in the abdomen or pelvis.   Electronically Signed   By: Burman Nieves M.D.   On: 09/20/2013 22:25     EKG Interpretation None      MDM   Final diagnoses:  Post-op pain  Nausea   MACKENZIE LIA presents with abd pain < 1 week s/p cholecystectomy.  Pt reports he was managing the pain well until early this AM.  He has associated nausea but no vomiting. Pt also reports intermittent bloody stools.  He reports a Hx of Chron's but denies bloody stools in the past.  Will check labs, give pain control and obtain a CT.  Labs reassuring and pt with tenderness on exam, but no rigidity or peritoneal signs.  Fecal occult negative and  no gross blood on DRE.    10:15PM Pt continues to have pain.  CT pending. Will redose pain control.  11:25 PM CT without acute process.  I personally reviewed the imaging tests through PACS system.   I reviewed available ER/hospitalization records through the EMR.    Pt to be d/c home with breakthrough pain control and nausea medication.  Pt without emesis here in the department.  Pt is very anxious.  I encouraged [facility]. He is to return here for pain and is out of control, fevers or intractable vomiting.  It has been determined that no acute conditions requiring further emergency intervention are present  at this time. The patient/guardian have been advised of the diagnosis and plan. We have discussed signs and symptoms that warrant return to the ED, such as changes or worsening in symptoms.   Vital signs are stable at discharge.   BP 124/82  Pulse 73  Temp(Src) 97.8 F (36.6 C) (Oral)  Resp 19  Ht 6\' 2"  (1.88 m)  Wt 185 lb (83.915 kg)  BMI 23.74 kg/m2  SpO2 97%  Patient/guardian has voiced understanding and agreed to follow-up with the PCP or specialist.    The patient was discussed with and seen by Dr. Loretha Stapler who agrees with the treatment plan.      Dierdre Forth, PA-C 09/20/13 2357  Dierdre Forth, PA-C 09/20/13 2357

## 2013-09-20 NOTE — ED Notes (Signed)
Pt presents with stomach pain since early this am. Umbilical area only. Vicodin pain medication has help until this am. Surgery for gallbladder at [facility] Denies N/V/D and fever

## 2013-09-20 NOTE — ED Notes (Signed)
Pt presents with stomach pain

## 2013-09-20 NOTE — ED Notes (Signed)
Per pt he has been noticing blood in his stool.

## 2013-09-20 NOTE — ED Notes (Signed)
Pt returned from XRAY 

## 2013-09-21 NOTE — ED Provider Notes (Signed)
Medical screening examination/treatment/procedure(s) were conducted as a shared visit with non-physician practitioner(s) and myself.  I personally evaluated the patient during the encounter.   Please see my separate note.     Candyce Churn III, MD 09/21/13 1230

## 2013-09-21 NOTE — ED Provider Notes (Signed)
Medical screening examination/treatment/procedure(s) were conducted as a shared visit with non-physician practitioner(s) and myself.  I personally evaluated the patient during the encounter.   EKG Interpretation None      28 yo male with recent cholecystectomy presenting with abdominal pain.  On exam, well appearing, slightly anxious, nontoxic, abd soft with surgical laparoscopy incisions which appear clean without erythema or drainage, has some ecchymosis surrounding umbilical incision, TTP most severe in periumbilical region without R/RG.  CT obtained without contrast due to allergy, without evidence of acute process.  Plan close outpatient follow up.   Clinical Impression: 1. Post-op pain   2. Nausea       Candyce Churn III, MD 09/21/13 1230

## 2013-09-29 ENCOUNTER — Ambulatory Visit: Payer: Self-pay | Admitting: Internal Medicine

## 2013-10-02 ENCOUNTER — Emergency Department (HOSPITAL_COMMUNITY)
Admission: EM | Admit: 2013-10-02 | Discharge: 2013-10-02 | Disposition: A | Discharge status: Home / Self Care | Payer: Non-veteran care | Attending: Emergency Medicine | Admitting: Emergency Medicine

## 2013-10-02 ENCOUNTER — Encounter (HOSPITAL_COMMUNITY): Payer: Self-pay | Admitting: Emergency Medicine

## 2013-10-02 DIAGNOSIS — Z8781 Personal history of (healed) traumatic fracture: Secondary | ICD-10-CM | POA: Insufficient documentation

## 2013-10-02 DIAGNOSIS — K5090 Crohn's disease, unspecified, without complications: Secondary | ICD-10-CM | POA: Diagnosis not present

## 2013-10-02 DIAGNOSIS — G8918 Other acute postprocedural pain: Secondary | ICD-10-CM

## 2013-10-02 DIAGNOSIS — Z79899 Other long term (current) drug therapy: Secondary | ICD-10-CM | POA: Insufficient documentation

## 2013-10-02 DIAGNOSIS — IMO0002 Reserved for concepts with insufficient information to code with codable children: Secondary | ICD-10-CM | POA: Insufficient documentation

## 2013-10-02 DIAGNOSIS — F17200 Nicotine dependence, unspecified, uncomplicated: Secondary | ICD-10-CM | POA: Diagnosis not present

## 2013-10-02 DIAGNOSIS — Z8659 Personal history of other mental and behavioral disorders: Secondary | ICD-10-CM | POA: Insufficient documentation

## 2013-10-02 DIAGNOSIS — Y836 Removal of other organ (partial) (total) as the cause of abnormal reaction of the patient, or of later complication, without mention of misadventure at the time of the procedure: Secondary | ICD-10-CM | POA: Diagnosis not present

## 2013-10-02 LAB — COMPREHENSIVE METABOLIC PANEL
ALT: 32 U/L (ref 0–53)
AST: 12 U/L (ref 0–37)
Albumin: 3.6 g/dL (ref 3.5–5.2)
Alkaline Phosphatase: 61 U/L (ref 39–117)
BUN: 6 mg/dL (ref 6–23)
CALCIUM: 9.7 mg/dL (ref 8.4–10.5)
CO2: 25 meq/L (ref 19–32)
CREATININE: 0.86 mg/dL (ref 0.50–1.35)
Chloride: 105 mEq/L (ref 96–112)
GLUCOSE: 87 mg/dL (ref 70–99)
Potassium: 4.2 mEq/L (ref 3.7–5.3)
Sodium: 142 mEq/L (ref 137–147)
TOTAL PROTEIN: 6.3 g/dL (ref 6.0–8.3)
Total Bilirubin: 0.3 mg/dL (ref 0.3–1.2)

## 2013-10-02 LAB — CBC WITH DIFFERENTIAL/PLATELET
Basophils Absolute: 0 10*3/uL (ref 0.0–0.1)
Basophils Relative: 0 % (ref 0–1)
EOS ABS: 0.4 10*3/uL (ref 0.0–0.7)
EOS PCT: 3 % (ref 0–5)
HEMATOCRIT: 43.9 % (ref 39.0–52.0)
Hemoglobin: 15.1 g/dL (ref 13.0–17.0)
LYMPHS ABS: 3.2 10*3/uL (ref 0.7–4.0)
Lymphocytes Relative: 29 % (ref 12–46)
MCH: 32.8 pg (ref 26.0–34.0)
MCHC: 34.4 g/dL (ref 30.0–36.0)
MCV: 95.4 fL (ref 78.0–100.0)
MONO ABS: 1 10*3/uL (ref 0.1–1.0)
Monocytes Relative: 9 % (ref 3–12)
Neutro Abs: 6.4 10*3/uL (ref 1.7–7.7)
Neutrophils Relative %: 58 % (ref 43–77)
Platelets: 267 10*3/uL (ref 150–400)
RBC: 4.6 MIL/uL (ref 4.22–5.81)
RDW: 12.9 % (ref 11.5–15.5)
WBC: 11 10*3/uL — AB (ref 4.0–10.5)

## 2013-10-02 LAB — I-STAT CG4 LACTIC ACID, ED: LACTIC ACID, VENOUS: 0.89 mmol/L (ref 0.5–2.2)

## 2013-10-02 LAB — LIPASE, BLOOD: LIPASE: 19 U/L (ref 11–59)

## 2013-10-02 MED ORDER — MORPHINE SULFATE 4 MG/ML IJ SOLN
4.0000 mg | Freq: Once | INTRAMUSCULAR | Status: AC
  Administered 2013-10-02: 4 mg via INTRAVENOUS
  Filled 2013-10-02: qty 1

## 2013-10-02 MED ORDER — ONDANSETRON HCL 4 MG/2ML IJ SOLN
4.0000 mg | Freq: Once | INTRAMUSCULAR | Status: AC
  Administered 2013-10-02: 4 mg via INTRAVENOUS
  Filled 2013-10-02: qty 2

## 2013-10-02 MED ORDER — SODIUM CHLORIDE 0.9 % IV BOLUS (SEPSIS)
1000.0000 mL | Freq: Once | INTRAVENOUS | Status: AC
  Administered 2013-10-02: 1000 mL via INTRAVENOUS

## 2013-10-02 NOTE — Discharge Instructions (Signed)
You were seen today for abdominal pain. This is likely related to your prior surgery. All lab testing is reassuring and you were able to tolerate fluids. At this time I do not feel that further imaging is needed. You should followup with her primary surgeon as soon as possible.  Abdominal Pain, Adult Many things can cause abdominal pain. Usually, abdominal pain is not caused by a disease and will improve without treatment. It can often be observed and treated at home. Your health care provider will do a physical exam and possibly order blood tests and X-rays to help determine the seriousness of your pain. However, in many cases, more time must pass before a clear cause of the pain can be found. Before that point, your health care provider may not know if you need more testing or further treatment. HOME CARE INSTRUCTIONS  Monitor your abdominal pain for any changes. The following actions may help to alleviate any discomfort you are experiencing:  Only take over-the-counter or prescription medicines as directed by your health care provider.  Do not take laxatives unless directed to do so by your health care provider.  Try a clear liquid diet (broth, tea, or water) as directed by your health care provider. Slowly move to a bland diet as tolerated. SEEK MEDICAL CARE IF:  You have unexplained abdominal pain.  You have abdominal pain associated with nausea or diarrhea.  You have pain when you urinate or have a bowel movement.  You experience abdominal pain that wakes you in the night.  You have abdominal pain that is worsened or improved by eating food.  You have abdominal pain that is worsened with eating fatty foods. SEEK IMMEDIATE MEDICAL CARE IF:   Your pain does not go away within 2 hours.  You have a fever.  You keep throwing up (vomiting).  Your pain is felt only in portions of the abdomen, such as the right side or the left lower portion of the abdomen.  You pass bloody or black  tarry stools. MAKE SURE YOU:  Understand these instructions.   Will watch your condition.   Will get help right away if you are not doing well or get worse.  Document Released: 02/27/2005 Document Revised: 03/10/2013 Document Reviewed: 01/27/2013 [facility] Patient Information 2014 [place], [place].

## 2013-10-02 NOTE — ED Notes (Signed)
IV flushed.  PO Ginger ale offered for fluid challenge. States pain is returning, cannot give a number.

## 2013-10-02 NOTE — ED Notes (Addendum)
Pt states he had a cholecystectomy 4/15.  States that he has been having increasing pain since then and that one of his stitches is hot to the touch.  Was informed by his surgeon to come in.  Has been running a fever today.

## 2013-10-02 NOTE — ED Provider Notes (Signed)
CSN: [id number]     Arrival date & time 10/02/13  1017 History   First MD Initiated Contact with Patient 10/02/13 1107     Chief Complaint  Patient presents with  . Post-op Problem  . Abdominal Pain     (Consider location/radiation/quality/duration/timing/severity/associated sxs/prior Treatment) HPI  This is a 28 year old male with a history of Crohn's disease and recent cholecystectomy who presents with abdominal pain. Patient reports increasing. Umbilical abdominal pain. He states he had a [facility] [facility] [place] 2[place]5 weeks ago.  He states that he has generally been doing well. He did note bruising around his umbilicus one week after surgery and was seen for that. He is taking pain and nausea medicine at home which she states "mostly helps."  However, patient states that last night he had increasing pain currently he rates his pain at 8/10. Pain is worse when he sneezes. He denies any vomiting but does endorse nausea. He denies any constipation but has had loose stools there have been non-bloody. Patient reports a fever to 100.3 this morning. No known sick contacts.  Past Medical History  Diagnosis Date  . Crohn's disease   . ADHD (attention deficit hyperactivity disorder)   . Foot fracture, right    Past Surgical History  Procedure Laterality Date  . Gallbladder surgery     History reviewed. No pertinent family history. History  Substance Use Topics  . Smoking status: Current Every Day Smoker -- 1.00 packs/day    Types: Cigarettes  . Smokeless tobacco: Not on file  . Alcohol Use: No    Review of Systems  Constitutional: Positive for fever.  Respiratory: Negative.  Negative for chest tightness and shortness of breath.   Cardiovascular: Negative.  Negative for chest pain.  Gastrointestinal: Positive for nausea, abdominal pain and diarrhea. Negative for vomiting and constipation.  Genitourinary: Negative.  Negative for dysuria.  Musculoskeletal: Negative for back  pain.  Skin: Positive for wound.  Neurological: Negative for headaches.  All other systems reviewed and are negative.     Allergies  Ivp dye and Nsaids  Home Medications   Prior to Admission medications   Medication Sig Start Date End Date Taking? Authorizing Provider  HYDROcodone-acetaminophen (NORCO) 10-325 MG per tablet Take 1 tablet by mouth every 6 (six) hours as needed (pain.).    Historical Provider, MD  Mesalamine (DELZICOL) 400 MG CPDR DR capsule Take 400 mg by mouth 2 (two) times daily.    Historical Provider, MD  ondansetron (ZOFRAN ODT) 8 MG disintegrating tablet 8mg  ODT q4 hours prn nausea 09/20/13   Hannah Muthersbaugh, PA-C  oxyCODONE (ROXICODONE) 5 MG immediate release tablet Take 1 tablet (5 mg total) by mouth every 4 (four) hours as needed. For severe breakthrough pain 09/20/13   Dahlia ClientHannah Muthersbaugh, PA-C  promethazine (PHENERGAN) 25 MG tablet Take 25 mg by mouth every 6 (six) hours as needed for nausea or vomiting.    Historical Provider, MD   BP 122/78  Pulse 71  Temp(Src) 98.1 F (36.7 C) (Oral)  Resp 16  Wt 190 lb (86.183 kg)  SpO2 98% Physical Exam  Nursing note and vitals reviewed. Constitutional: He is oriented to person, place, and time. He appears well-developed and well-nourished.  HENT:  Head: Normocephalic and atraumatic.  Mouth/Throat: Oropharynx is clear and moist.  Eyes: Pupils are equal, round, and reactive to light.  Cardiovascular: Normal rate, regular rhythm and normal heart sounds.   No murmur heard. Pulmonary/Chest: Effort normal and breath sounds normal. No  respiratory distress. He has no wheezes.  Abdominal: Soft. Bowel sounds are normal. He exhibits no distension. There is no tenderness. There is no rebound.  Mild tenderness to palpation about the epigastrium, multiple laparoscopy incisions well-healing and without evidence of erythema, ecchymosis noted inferior and left of the umbilicus  Musculoskeletal: He exhibits no edema.   Lymphadenopathy:    He has no cervical adenopathy.  Neurological: He is alert and oriented to person, place, and time.  Skin: Skin is warm and dry.  Psychiatric: He has a normal mood and affect.    ED Course  Procedures (including critical care time) Labs Review Labs Reviewed  CBC WITH DIFFERENTIAL - Abnormal; Notable for the following:    WBC 11.0 (*)    All other components within normal limits  COMPREHENSIVE METABOLIC PANEL  LIPASE, BLOOD  I-STAT CG4 LACTIC ACID, ED    Imaging Review No results found.   EKG Interpretation None      MDM   Final diagnoses:  Post-op pain    Patient presents with abdominal pain. Cholecystectomy 2 and half weeks ago. He is otherwise nontoxic-appearing. He has been afebrile here and vital signs are within normal limits. Mild tenderness to palpation of the epigastrium. No signs of peritonitis.  Labwork reassuring including lactate.  Had prior postop CT 4/19 which was reassuring. Patient given pain and nausea medication.  Able to tolerate PO.  [facility] and [facility].  After history, exam, and medical workup I feel the patient has been appropriately medically screened and is safe for discharge home. Pertinent diagnoses were discussed with the patient. Patient was given return precautions.    Shon Baton, MD 10/02/13 2018

## 2013-11-10 ENCOUNTER — Encounter (HOSPITAL_COMMUNITY): Payer: Self-pay | Admitting: Emergency Medicine

## 2013-11-10 ENCOUNTER — Emergency Department (HOSPITAL_COMMUNITY): Payer: BC Managed Care – PPO

## 2013-11-10 ENCOUNTER — Emergency Department (HOSPITAL_COMMUNITY)
Admission: EM | Admit: 2013-11-10 | Discharge: 2013-11-10 | Disposition: A | Discharge status: Home / Self Care | Payer: BC Managed Care – PPO | Attending: Emergency Medicine | Admitting: Emergency Medicine

## 2013-11-10 DIAGNOSIS — R110 Nausea: Secondary | ICD-10-CM | POA: Insufficient documentation

## 2013-11-10 DIAGNOSIS — L03319 Cellulitis of trunk, unspecified: Principal | ICD-10-CM

## 2013-11-10 DIAGNOSIS — R197 Diarrhea, unspecified: Secondary | ICD-10-CM | POA: Insufficient documentation

## 2013-11-10 DIAGNOSIS — Z8639 Personal history of other endocrine, nutritional and metabolic disease: Secondary | ICD-10-CM | POA: Insufficient documentation

## 2013-11-10 DIAGNOSIS — L24A9 Irritant contact dermatitis due friction or contact with other specified body fluids: Secondary | ICD-10-CM

## 2013-11-10 DIAGNOSIS — Z9089 Acquired absence of other organs: Secondary | ICD-10-CM | POA: Insufficient documentation

## 2013-11-10 DIAGNOSIS — G8918 Other acute postprocedural pain: Secondary | ICD-10-CM | POA: Insufficient documentation

## 2013-11-10 DIAGNOSIS — F17200 Nicotine dependence, unspecified, uncomplicated: Secondary | ICD-10-CM | POA: Insufficient documentation

## 2013-11-10 DIAGNOSIS — R109 Unspecified abdominal pain: Secondary | ICD-10-CM | POA: Diagnosis present

## 2013-11-10 DIAGNOSIS — Z862 Personal history of diseases of the blood and blood-forming organs and certain disorders involving the immune mechanism: Secondary | ICD-10-CM | POA: Insufficient documentation

## 2013-11-10 DIAGNOSIS — Z8669 Personal history of other diseases of the nervous system and sense organs: Secondary | ICD-10-CM | POA: Insufficient documentation

## 2013-11-10 DIAGNOSIS — R10811 Right upper quadrant abdominal tenderness: Secondary | ICD-10-CM | POA: Insufficient documentation

## 2013-11-10 DIAGNOSIS — T148XXA Other injury of unspecified body region, initial encounter: Secondary | ICD-10-CM | POA: Diagnosis present

## 2013-11-10 DIAGNOSIS — L02219 Cutaneous abscess of trunk, unspecified: Secondary | ICD-10-CM | POA: Insufficient documentation

## 2013-11-10 LAB — COMPREHENSIVE METABOLIC PANEL
ALT: 22 U/L (ref 0–53)
AST: 14 U/L (ref 0–37)
Albumin: 4.1 g/dL (ref 3.5–5.2)
Alkaline Phosphatase: 65 U/L (ref 39–117)
BUN: 7 mg/dL (ref 6–23)
CALCIUM: 9 mg/dL (ref 8.4–10.5)
CO2: 22 meq/L (ref 19–32)
Chloride: 103 mEq/L (ref 96–112)
Creatinine, Ser: 0.85 mg/dL (ref 0.50–1.35)
GFR calc Af Amer: >90 mL/min (ref >90)
Glucose, Bld: 99 mg/dL (ref 70–99)
POTASSIUM: 4.2 meq/L (ref 3.7–5.3)
SODIUM: 142 meq/L (ref 137–147)
TOTAL PROTEIN: 7.2 g/dL (ref 6.0–8.3)
Total Bilirubin: 0.4 mg/dL (ref 0.3–1.2)

## 2013-11-10 LAB — CBC WITH DIFFERENTIAL/PLATELET
Basophils Absolute: 0 10*3/uL (ref 0.0–0.1)
Basophils Relative: 0 % (ref 0–1)
EOS PCT: 2 % (ref 0–5)
Eosinophils Absolute: 0.2 10*3/uL (ref 0.0–0.7)
HCT: 49.1 % (ref 39.0–52.0)
Hemoglobin: 17.2 g/dL — ABNORMAL HIGH (ref 13.0–17.0)
Lymphocytes Relative: 39 % (ref 12–46)
Lymphs Abs: 4.5 10*3/uL — ABNORMAL HIGH (ref 0.7–4.0)
MCH: 34 pg (ref 26.0–34.0)
MCHC: 35 g/dL (ref 30.0–36.0)
MCV: 97 fL (ref 78.0–100.0)
Monocytes Absolute: 1 10*3/uL (ref 0.1–1.0)
Monocytes Relative: 9 % (ref 3–12)
Neutro Abs: 5.7 10*3/uL (ref 1.7–7.7)
Neutrophils Relative %: 50 % (ref 43–77)
Platelets: 260 10*3/uL (ref 150–400)
RBC: 5.06 MIL/uL (ref 4.22–5.81)
RDW: 13.3 % (ref 11.5–15.5)
WBC: 11.4 10*3/uL — ABNORMAL HIGH (ref 4.0–10.5)

## 2013-11-10 MED ORDER — ACETAMINOPHEN 325 MG PO TABS
650.0000 mg | ORAL_TABLET | Freq: Once | ORAL | Status: AC
  Administered 2013-11-10: 650 mg via ORAL
  Filled 2013-11-10: qty 2

## 2013-11-10 MED ORDER — SODIUM CHLORIDE 0.9 % IV BOLUS (SEPSIS)
1000.0000 mL | INTRAVENOUS | Status: AC
  Administered 2013-11-10: 1000 mL via INTRAVENOUS

## 2013-11-10 MED ORDER — BARIUM SULFATE 2.1 % PO SUSP: 900.0000 mL | Freq: Once | ORAL | Status: DC

## 2013-11-10 MED ORDER — ONDANSETRON HCL 4 MG/2ML IJ SOLN
4.0000 mg | Freq: Once | INTRAMUSCULAR | Status: AC
  Administered 2013-11-10: 4 mg via INTRAVENOUS
  Filled 2013-11-10: qty 2

## 2013-11-10 NOTE — ED Notes (Signed)
Pt states that he had a laparoscopic surgery 4/15 and has been having drainage from his umbilical incision for the past two days.  Pt states he has been having puss and red drainage.  Pt states he called his surgeon, and was told to come to the ED.  Pt states the surgery was done at the [facility] [place], [place]

## 2013-11-10 NOTE — ED Notes (Signed)
Patient here with complaint of abdominal pain related to draining incision site. Patient has 4 lap sites on abdomen from gallbladder removal. Site near umbilicus was reported to have been draining puss and is tender to the touch.

## 2013-11-10 NOTE — ED Provider Notes (Signed)
CSN: [id number]     Arrival date & time 11/10/13  0343 History   First MD Initiated Contact with Patient 11/10/13 [phone]     Chief Complaint  Patient presents with  . Drainage from Incision     (Consider location/radiation/quality/duration/timing/severity/associated sxs/prior Treatment) Patient is a 28 y.o. male presenting with abdominal pain. The history is provided by the patient.  Abdominal Pain Pain location:  RUQ Pain quality: aching   Pain radiates to:  Does not radiate Pain severity:  Mild Onset quality:  Gradual Timing:  Constant Progression:  Unchanged Chronicity:  Chronic Context comment:  S/p cholecystectomy in April Relieved by: pain meds at home. Worsened by:  Nothing tried Ineffective treatments:  None tried Associated symptoms: diarrhea and nausea   Associated symptoms: no chest pain, no cough, no dysuria, no fever, no hematuria, no shortness of breath and no vomiting     Past Medical History  Diagnosis Date  . Crohn's disease   . ADHD (attention deficit hyperactivity disorder)   . Foot fracture, right    Past Surgical History  Procedure Laterality Date  . Gallbladder surgery     History reviewed. No pertinent family history. History  Substance Use Topics  . Smoking status: Current Every Day Smoker -- 1.00 packs/day    Types: Cigarettes  . Smokeless tobacco: Not on file  . Alcohol Use: No    Review of Systems  Constitutional: Negative for fever.  HENT: Negative for drooling and rhinorrhea.   Eyes: Negative for pain.  Respiratory: Negative for cough and shortness of breath.   Cardiovascular: Negative for chest pain and leg swelling.  Gastrointestinal: Positive for nausea, abdominal pain and diarrhea. Negative for vomiting.  Genitourinary: Negative for dysuria and hematuria.  Musculoskeletal: Negative for gait problem and neck pain.  Skin: Negative for color change.  Neurological: Negative for numbness and headaches.  Hematological: Negative for  adenopathy.  Psychiatric/Behavioral: Negative for behavioral problems.  All other systems reviewed and are negative.     Allergies  Ivp dye and Nsaids  Home Medications   Prior to Admission medications   Not on File   BP 131/79  Pulse 110  Temp(Src) 98 F (36.7 C) (Oral)  Resp 20  Ht 6\' 2"  (1.88 m)  Wt 190 lb (86.183 kg)  BMI 24.38 kg/m2  SpO2 95% Physical Exam  Nursing note and vitals reviewed. Constitutional: He is oriented to person, place, and time. He appears well-developed and well-nourished.  HENT:  Head: Normocephalic and atraumatic.  Right Ear: External ear normal.  Left Ear: External ear normal.  Nose: Nose normal.  Mouth/Throat: Oropharynx is clear and moist. No oropharyngeal exudate.  Eyes: Conjunctivae and EOM are normal. Pupils are equal, round, and reactive to light.  Neck: Normal range of motion. Neck supple.  Cardiovascular: Normal rate, regular rhythm, normal heart sounds and intact distal pulses.  Exam reveals no gallop and no friction rub.   No murmur heard. Pulmonary/Chest: Effort normal and breath sounds normal. No respiratory distress. He has no wheezes.  Abdominal: Soft. Bowel sounds are normal. He exhibits no distension. There is tenderness (mild tenderness to palpation of the right upper quadrant.). There is no rebound and no guarding.  Laparotomy sites on the abdomen appear well healed and without erythema or evidence of infection. No drainage noted w/ palpation.   Musculoskeletal: Normal range of motion. He exhibits no edema and no tenderness.  Neurological: He is alert and oriented to person, place, and time.  Skin: Skin is warm  and dry.  Psychiatric: He has a normal mood and affect. His behavior is normal.    ED Course  Procedures (including critical care time) Labs Review Labs Reviewed  CBC WITH DIFFERENTIAL - Abnormal; Notable for the following:    WBC 11.4 (*)    Hemoglobin 17.2 (*)    Lymphs Abs 4.5 (*)    All other components  within normal limits  COMPREHENSIVE METABOLIC PANEL    Imaging Review Ct Abdomen Pelvis Wo Contrast  11/10/2013   CLINICAL DATA:  Chronic incisional tenderness following cholecystectomy. Red purulent drainage.  EXAM: CT ABDOMEN AND PELVIS WITHOUT CONTRAST  TECHNIQUE: Multidetector CT imaging of the abdomen and pelvis was performed following the standard protocol without IV contrast.  COMPARISON:  09/20/2013  FINDINGS: Lung bases are clear.  No pleural or pericardial fluid.  The liver has a normal appearance without focal lesions or biliary ductal dilatation. There has been cholecystectomy. No complication is seen in the region of the gallbladder bit added. There appears to be an infraumbilical surgical portal. Soft tissue density in this region appears slightly more prominent and this could be consistent with inflammation along this approach. No evidence of an actual fluid collection or abscess.  The spleen is normal. The pancreas is normal. The adrenal glands are normal. The kidneys are normal. The aorta and IVC are normal. No bowel pathology is seen. The appendix is normal. No fluid in the pelvis. Bladder, prostate gland and seminal vesicles are unremarkable.  IMPRESSION: Normal intra abdominal appearance following cholecystectomy. No evidence complication in the gallbladder fossa region. There appears to be an infraumbilical portal. One could suggest that the soft tissue in that region is slightly more prominent than was seen on 09/20/2013 in this could go along with some inflammation. There is no evidence of drainable fluid collection or abscess by imaging however.   Electronically Signed   By: Paulina Fusi M.D.   On: 11/10/2013 07:15     EKG Interpretation None      MDM   Final diagnoses:  Abdominal pain  Drainage from wound    5:29 AM 28 y.o. male with a history of Crohn's disease, status post cholecystectomy on April 15 at [facility] who presents with bloody drainage from the  periumbilical laparotomy site. He notes that he noticed it when he got up from bed at approximately 1 AM this morning. He states that he has some chronic abdominal pain since the surgery but this is unchanged. The patient is afebrile and vital signs are unremarkable here. Screening labwork is noncontributory thus far. Will get CT of abdomen. I will a suspicion for any acute intra-abdominal process. There is no evidence of any open wounds on his abdomen.  7:30 AM: Mildly elev wbc. Question of infraumbilical portal on CT, but no evidence of abscess. Wounds appear clean, dry, and intact on my exam. Pt possibly irritated surgical wound unknowingly while getting up in the night. He continues to appear well and has a benign abdomen.  I have discussed the diagnosis/risks/treatment options with the patient and believe the pt to be eligible for discharge home to follow-up with his surgeon this week. We also discussed returning to the ED immediately if new or worsening sx occur. We discussed the sx which are most concerning (e.g., fever, vomiting, worsening pain) that necessitate immediate return. Medications administered to the patient during their visit and any new prescriptions provided to the patient are listed below.  Medications given during this visit Medications  Barium  Sulfate 2.1 % SUSP 900 mL (not administered)  sodium chloride 0.9 % bolus 1,000 mL (1,000 mLs Intravenous New Bag/Given 11/10/13 0551)  ondansetron (ZOFRAN) injection 4 mg (4 mg Intravenous Given 11/10/13 0551)  acetaminophen (TYLENOL) tablet 650 mg (650 mg Oral Given 11/10/13 0556)    New Prescriptions   No medications on file     Junius Argyle, MD 11/10/13 1714

## 2014-01-17 ENCOUNTER — Ambulatory Visit: Payer: Self-pay | Admitting: Sports Medicine

## 2014-01-17 DIAGNOSIS — Z0289 Encounter for other administrative examinations: Secondary | ICD-10-CM

## 2014-03-31 ENCOUNTER — Observation Stay (HOSPITAL_COMMUNITY): Payer: Non-veteran care

## 2014-03-31 ENCOUNTER — Emergency Department (HOSPITAL_COMMUNITY): Payer: Non-veteran care

## 2014-03-31 ENCOUNTER — Observation Stay (HOSPITAL_COMMUNITY)
Admission: EM | Admit: 2014-03-31 | Discharge: 2014-04-02 | Disposition: A | Discharge status: Home / Self Care | Payer: Non-veteran care | Attending: Internal Medicine | Admitting: Internal Medicine

## 2014-03-31 ENCOUNTER — Encounter (HOSPITAL_COMMUNITY): Payer: Self-pay | Admitting: Emergency Medicine

## 2014-03-31 DIAGNOSIS — G43001 Migraine without aura, not intractable, with status migrainosus: Secondary | ICD-10-CM

## 2014-03-31 DIAGNOSIS — Z87820 Personal history of traumatic brain injury: Secondary | ICD-10-CM | POA: Insufficient documentation

## 2014-03-31 DIAGNOSIS — K5090 Crohn's disease, unspecified, without complications: Secondary | ICD-10-CM | POA: Insufficient documentation

## 2014-03-31 DIAGNOSIS — R55 Syncope and collapse: Principal | ICD-10-CM | POA: Insufficient documentation

## 2014-03-31 DIAGNOSIS — R29898 Other symptoms and signs involving the musculoskeletal system: Secondary | ICD-10-CM

## 2014-03-31 DIAGNOSIS — F17210 Nicotine dependence, cigarettes, uncomplicated: Secondary | ICD-10-CM | POA: Insufficient documentation

## 2014-03-31 DIAGNOSIS — R569 Unspecified convulsions: Secondary | ICD-10-CM

## 2014-03-31 DIAGNOSIS — F909 Attention-deficit hyperactivity disorder, unspecified type: Secondary | ICD-10-CM | POA: Insufficient documentation

## 2014-03-31 DIAGNOSIS — R531 Weakness: Secondary | ICD-10-CM | POA: Diagnosis not present

## 2014-03-31 DIAGNOSIS — E872 Acidosis: Secondary | ICD-10-CM | POA: Insufficient documentation

## 2014-03-31 DIAGNOSIS — K50919 Crohn's disease, unspecified, with unspecified complications: Secondary | ICD-10-CM

## 2014-03-31 DIAGNOSIS — G43909 Migraine, unspecified, not intractable, without status migrainosus: Secondary | ICD-10-CM | POA: Insufficient documentation

## 2014-03-31 LAB — CBC WITH DIFFERENTIAL/PLATELET
BASOS PCT: 0 % (ref 0–1)
Basophils Absolute: 0 10*3/uL (ref 0.0–0.1)
EOS ABS: 0.2 10*3/uL (ref 0.0–0.7)
Eosinophils Relative: 1 % (ref 0–5)
HEMATOCRIT: 46.6 % (ref 39.0–52.0)
Hemoglobin: 17 g/dL (ref 13.0–17.0)
Lymphocytes Relative: 37 % (ref 12–46)
Lymphs Abs: 4.4 10*3/uL — ABNORMAL HIGH (ref 0.7–4.0)
MCH: 34.9 pg — AB (ref 26.0–34.0)
MCHC: 36.5 g/dL — AB (ref 30.0–36.0)
MCV: 95.7 fL (ref 78.0–100.0)
MONO ABS: 0.8 10*3/uL (ref 0.1–1.0)
Monocytes Relative: 7 % (ref 3–12)
Neutro Abs: 6.4 10*3/uL (ref 1.7–7.7)
Neutrophils Relative %: 55 % (ref 43–77)
Platelets: 266 10*3/uL (ref 150–400)
RBC: 4.87 MIL/uL (ref 4.22–5.81)
RDW: 12.6 % (ref 11.5–15.5)
WBC: 11.7 10*3/uL — ABNORMAL HIGH (ref 4.0–10.5)

## 2014-03-31 LAB — URINALYSIS, ROUTINE W REFLEX MICROSCOPIC
Bilirubin Urine: NEGATIVE
Glucose, UA: NEGATIVE mg/dL
Hgb urine dipstick: NEGATIVE
KETONES UR: NEGATIVE mg/dL
Leukocytes, UA: NEGATIVE
NITRITE: NEGATIVE
PH: 5 (ref 5.0–8.0)
Protein, ur: NEGATIVE mg/dL
Specific Gravity, Urine: 1.003 — ABNORMAL LOW (ref 1.005–1.030)
Urobilinogen, UA: 0.2 mg/dL (ref 0.0–1.0)

## 2014-03-31 LAB — COMPREHENSIVE METABOLIC PANEL
ALT: 33 U/L (ref 0–53)
ANION GAP: 19 — AB (ref 5–15)
AST: 21 U/L (ref 0–37)
Albumin: 4 g/dL (ref 3.5–5.2)
Alkaline Phosphatase: 59 U/L (ref 39–117)
BUN: 8 mg/dL (ref 6–23)
CO2: 22 mEq/L (ref 19–32)
CREATININE: 1.04 mg/dL (ref 0.50–1.35)
Calcium: 9.3 mg/dL (ref 8.4–10.5)
Chloride: 103 mEq/L (ref 96–112)
GFR calc Af Amer: >90 mL/min (ref >90)
GFR calc non Af Amer: >90 mL/min (ref >90)
Glucose, Bld: 104 mg/dL — ABNORMAL HIGH (ref 70–99)
Potassium: 4 mEq/L (ref 3.7–5.3)
Sodium: 144 mEq/L (ref 137–147)
TOTAL PROTEIN: 7.2 g/dL (ref 6.0–8.3)
Total Bilirubin: <0.2 mg/dL — ABNORMAL LOW (ref 0.3–1.2)

## 2014-03-31 LAB — LIPASE, BLOOD: Lipase: 24 U/L (ref 11–59)

## 2014-03-31 LAB — I-STAT CG4 LACTIC ACID, ED: Lactic Acid, Venous: 2.38 mmol/L — ABNORMAL HIGH (ref 0.5–2.2)

## 2014-03-31 LAB — MAGNESIUM: MAGNESIUM: 2 mg/dL (ref 1.5–2.5)

## 2014-03-31 LAB — PHOSPHORUS: Phosphorus: 3.7 mg/dL (ref 2.3–4.6)

## 2014-03-31 LAB — GLUCOSE, CAPILLARY: Glucose-Capillary: 117 mg/dL — ABNORMAL HIGH (ref 70–99)

## 2014-03-31 LAB — CK: Total CK: 102 U/L (ref 7–232)

## 2014-03-31 MED ORDER — ACETAMINOPHEN 500 MG PO TABS
1000.0000 mg | ORAL_TABLET | Freq: Once | ORAL | Status: AC
  Administered 2014-03-31: 1000 mg via ORAL
  Filled 2014-03-31: qty 2

## 2014-03-31 MED ORDER — METOCLOPRAMIDE HCL 5 MG/ML IJ SOLN
10.0000 mg | Freq: Once | INTRAMUSCULAR | Status: AC
  Administered 2014-03-31: 10 mg via INTRAVENOUS
  Filled 2014-03-31: qty 2

## 2014-03-31 MED ORDER — ALUM & MAG HYDROXIDE-SIMETH 200-200-20 MG/5ML PO SUSP: 30.0000 mL | Freq: Four times a day (QID) | ORAL | Status: DC | PRN

## 2014-03-31 MED ORDER — LORAZEPAM 2 MG/ML IJ SOLN: 1.0000 mg | INTRAMUSCULAR | Status: DC | PRN

## 2014-03-31 MED ORDER — ONDANSETRON HCL 4 MG PO TABS
4.0000 mg | ORAL_TABLET | Freq: Four times a day (QID) | ORAL | Status: DC | PRN
  Administered 2014-04-01 (×2): 4 mg via ORAL
  Filled 2014-03-31 (×2): qty 1

## 2014-03-31 MED ORDER — ACETAMINOPHEN 325 MG PO TABS: 650.0000 mg | ORAL_TABLET | ORAL | Status: DC | PRN

## 2014-03-31 MED ORDER — OXYCODONE HCL 5 MG PO TABS
5.0000 mg | ORAL_TABLET | ORAL | Status: DC | PRN
  Administered 2014-03-31 – 2014-04-02 (×11): 5 mg via ORAL
  Filled 2014-03-31 (×12): qty 1

## 2014-03-31 MED ORDER — SODIUM CHLORIDE 0.9 % IV SOLN
INTRAVENOUS | Status: DC
  Administered 2014-03-31 – 2014-04-01 (×2): via INTRAVENOUS

## 2014-03-31 MED ORDER — HYDROMORPHONE HCL 1 MG/ML IJ SOLN
0.5000 mg | INTRAMUSCULAR | Status: DC | PRN
  Administered 2014-04-01: 1 mg via INTRAVENOUS
  Filled 2014-03-31: qty 1

## 2014-03-31 MED ORDER — ACETAMINOPHEN 650 MG RE SUPP: 650.0000 mg | Freq: Four times a day (QID) | RECTAL | Status: DC | PRN

## 2014-03-31 MED ORDER — SODIUM CHLORIDE 0.9 % IJ SOLN
3.0000 mL | Freq: Two times a day (BID) | INTRAMUSCULAR | Status: DC
  Administered 2014-03-31 – 2014-04-01 (×3): 3 mL via INTRAVENOUS

## 2014-03-31 MED ORDER — ONDANSETRON HCL 4 MG/2ML IJ SOLN
4.0000 mg | Freq: Four times a day (QID) | INTRAMUSCULAR | Status: DC | PRN
  Administered 2014-03-31: 4 mg via INTRAVENOUS
  Filled 2014-03-31: qty 2

## 2014-03-31 MED ORDER — DIPHENHYDRAMINE HCL 50 MG/ML IJ SOLN
50.0000 mg | Freq: Once | INTRAMUSCULAR | Status: AC
  Administered 2014-03-31: 50 mg via INTRAVENOUS
  Filled 2014-03-31: qty 1

## 2014-03-31 MED ORDER — ACETAMINOPHEN 325 MG PO TABS: 650.0000 mg | ORAL_TABLET | Freq: Four times a day (QID) | ORAL | Status: DC | PRN

## 2014-03-31 MED ORDER — SODIUM CHLORIDE 0.9 % IV BOLUS (SEPSIS)
1000.0000 mL | Freq: Once | INTRAVENOUS | Status: AC
  Administered 2014-03-31: 1000 mL via INTRAVENOUS

## 2014-03-31 NOTE — Consult Note (Signed)
Reason for Consult:Altered mental status Referring Physician: Oni  CC: Altered mental stat US, LE weakness  HPI: Justin [name] is an 28 y.o. male who was at home tonight.  The last thing he remembers is feeding the dogs.  His significant other was in another room.  She remembers hearing him come back in the house after letting the dogs out.  She reports that he was on the couch and she heard a noise.  When she came to the room the patient was on the floor.  He looked dazed and was confused.  He was unable to move his legs and EMS was called at that time.  The patient did not have any witnessed clonic activity.  There was no tongue biting or bowel/bladder incontinence.   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  He does not recall what his work up was and why he was told that he had that seizure but it seems that he had the same complaint of lower extremity problems after that event as well.  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 Patient reports that for the past 10 days he has had complaints of headaches.  They are holocranial and associated with nausea, photophobia and phonophobia.  He has been taking OTC medications with some relief in pain.   No recent history of illness or fever.  Past Medical History  Diagnosis Date  . Crohn's disease   . ADHD (attention deficit hyperactivity disorder)   . Foot fracture, right     Past Surgical History  Procedure Laterality Date  . Gallbladder surgery      Family history: Maternal grandfather with seizures  Social History:  reports that he has been smoking Cigarettes.  He has been smoking about 1.00 pack per day. He does not have any smokeless tobacco history on file. He reports that he drinks about 2 six packs of beer per week. He reports that he does not use illicit drugs.  Allergies  Allergen Reactions  . Iohexol Anaphylaxis    Pt had 1st sensitivity to Iohexol with periorbital  swelling. His reaction during scan 2 days later was throat swelling, respiratory distress; admitted to ICU  . Ivp Dye [Iodinated Diagnostic Agents]   . Nsaids     Crohns    Medications: I have reviewed the patient's current medications. Prior to Admission:  No current outpatient prescriptions on file.  ROS: History obtained from the patient  General ROS: negative for - chills, fatigue, fever, night sweats, weight gain or weight loss Psychological ROS: negative for - behavioral disorder, hallucinations, memory difficulties, mood swings or suicidal ideation Ophthalmic ROS: negative for - blurry vision, double vision, eye pain or loss of vision ENT ROS: negative for - epistaxis, nasal discharge, oral lesions, sore throat, tinnitus or vertigo Allergy and Immunology ROS: negative for - hives or itchy/watery eyes Hematological and Lymphatic ROS: negative for - bleeding problems, bruising or swollen lymph nodes Endocrine ROS: negative for - galactorrhea, hair pattern changes, polydipsia/polyuria or temperature intolerance Respiratory ROS: negative for - cough, hemoptysis, shortness of breath or wheezing Cardiovascular ROS: negative for - chest pain, dyspnea on exertion, edema or irregular heartbeat Gastrointestinal ROS: negative for - abdominal pain, diarrhea, hematemesis, nausea/vomiting or stool incontinence Genito-Urinary ROS: negative for - dysuria, hematuria, incontinence or urinary frequency/urgency Musculoskeletal ROS: negative for - joint swelling or muscular weakness Neurological ROS: as noted in HPI Dermatological ROS: negative for  rash and skin lesion changes  Physical Examination: Blood pressure 85/59, pulse 75, temperature 97.8 F (36.6 C), temperature source Oral, resp. rate 18, height 6\' 2"  (1.88 m), weight 88.451 kg (195 lb), SpO2 96.00%.  Neurologic Examination Mental Status: Lethargic.  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  The patient is actually in school.  Speech fluent without evidence of aphasia.  Able to follow 3 step commands without difficulty. Cranial Nerves: II: Discs flat bilaterally; Visual fields grossly normal, pupils equal, round, reactive to light and accommodation III,IV, VI: ptosis not present, extra-ocular motions intact bilaterally with bilateral, lateral gaze nystagmus V,VII: smile symmetric, facial light touch sensation normal bilaterally VIII: hearing normal bilaterally IX,X: gag reflex present XI: bilateral shoulder shrug XII: midline tongue extension Motor: Right : Upper extremity   5/5    Left:     Upper extremity   5/5 In the lower extremities the patient able to lift both legs off the bed but unable to get more than a few inches off the bed.  Gives full strength in that range.   Tone and bulk:normal tone throughout; no atrophy noted Sensory: Pinprick and light touch decreased from the mid calf downward.   Deep Tendon Reflexes: 2+ and symmetric throughout Plantars: Right: mute   Left: mute Cerebellar: normal finger-to-nose testing bilaterally.  Heel-to-shin testing unable to be performed Gait: Unable to test secondary to lower extremity weakness CV: pulses palpable throughout     Laboratory Studies:   Basic Metabolic Panel:  Recent Labs Lab 03/31/14 0230  NA 144  K 4.0  CL 103  CO2 22  GLUCOSE 104*  BUN 8  CREATININE 1.04  CALCIUM 9.3    Liver Function Tests:  Recent Labs Lab 03/31/14 0230  AST 21  ALT 33  ALKPHOS 59  BILITOT <0.2*  PROT 7.2  ALBUMIN 4.0    Recent Labs Lab 03/31/14 0230  LIPASE 24   No results found for this basename: AMMONIA,  in the last 168 hours  CBC:  Recent Labs Lab 03/31/14 0230  WBC 11.7*  NEUTROABS 6.4  HGB 17.0  HCT 46.6  MCV 95.7  PLT 266    Cardiac Enzymes: No results found for this basename: CKTOTAL, CKMB, CKMBINDEX, TROPONINI,  in the last 168 hours  BNP: No components found with  this basename: POCBNP,   CBG: No results found for this basename: GLUCAP,  in the last 168 hours  Microbiology: Results for orders placed during the hospital encounter of 06/20/09  CSF CELL COUNT WITH DIFFERENTIAL     Status: Abnormal   Collection Time    06/21/09  1:25 AM      Result Value Ref Range Status   Tube # 1   Final   Color, CSF COLORLESS  COLORLESS Final   Appearance, CSF CLEAR  CLEAR Final   Supernatant NOT INDICATED   Final   RBC Count, CSF 7 (*) 0 /cu mm Final   WBC, CSF 1  0 - 5 /cu mm Final   Segmented Neutrophils-CSF RARE TOO FEW TO COUNT, SMEAR AVAILABLE FOR REVIEW  0 - 6 % Final   Lymphs, CSF RARE  40 - 80 % Final  PROTEIN AND GLUCOSE, CSF     Status: None   Collection Time    06/21/09  1:25 AM      Result Value Ref Range Status   Glucose, CSF 72  43 - 76 mg/dL  Final   Total  Protein, CSF 39  15 - 45 mg/dL Final  CSF CELL COUNT WITH DIFFERENTIAL     Status: Abnormal   Collection Time    06/21/09  1:26 AM      Result Value Ref Range Status   Tube # 4   Final   Color, CSF COLORLESS  COLORLESS Final   Appearance, CSF CLEAR  CLEAR Final   Supernatant NOT INDICATED   Final   RBC Count, CSF 1 (*) 0 /cu mm Final   WBC, CSF 6 (*) 0 - 5 /cu mm Final   Segmented Neutrophils-CSF RARE TOO FEW TO COUNT, SMEAR AVAILABLE FOR REVIEW  0 - 6 % Final   Lymphs, CSF FEW  40 - 80 % Final   Monocyte-Macrophage-Spinal Fluid RARE  15 - 45 % Final  CSF CULTURE     Status: None   Collection Time    06/21/09  1:30 AM      Result Value Ref Range Status   Specimen Description CSF   Final   Special Requests NONE   Final   Gram Stain     Final   Value: CYTOSPIN WBC PRESENT,BOTH PMN AND MONONUCLEAR     NO ORGANISMS SEEN   Culture NO GROWTH 3 DAYS   Final   Report Status 06/24/2009 FINAL   Final    Coagulation Studies: No results found for this basename: LABPROT, INR,  in the last 72 hours  Urinalysis:  Recent Labs Lab 03/31/14 0240  COLORURINE YELLOW  LABSPEC 1.003*   PHURINE 5.0  GLUCOSEU NEGATIVE  HGBUR NEGATIVE  BILIRUBINUR NEGATIVE  KETONESUR NEGATIVE  PROTEINUR NEGATIVE  UROBILINOGEN 0.2  NITRITE NEGATIVE  LEUKOCYTESUR NEGATIVE    Lipid Panel:  No results found for this basename: chol, trig, hdl, cholhdl, vldl, ldlcalc    HgbA1C:  No results found for this basename: HGBA1C    Urine Drug Screen:     Component Value Date/Time   LABOPIA NONE DETECTED 01/08/2013 0318   COCAINSCRNUR NONE DETECTED 01/08/2013 0318   LABBENZ NONE DETECTED 01/08/2013 0318   AMPHETMU NONE DETECTED 01/08/2013 0318   THCU NONE DETECTED 01/08/2013 0318   LABBARB NONE DETECTED 01/08/2013 0318    Alcohol Level: No results found for this basename: ETH,  in the last 168 hours  Other results: EKG: sinus rhythm at 94 bpm  Imaging: Dg Chest 2 View  03/31/2014   CLINICAL DATA:  Seizure.  EXAM: CHEST  2 VIEW  COMPARISON:  02/28/2013  FINDINGS: Normal heart size and mediastinal contours. Mildly low lung volumes. No acute infiltrate or edema. No effusion or pneumothorax. No acute osseous findings.  IMPRESSION: No acute findings.   Electronically Signed   By: Tiburcio Pea M.D.   On: 03/31/2014 03:24   Ct Head Wo Contrast  03/31/2014   CLINICAL DATA:  Seizure, fall.  EXAM: CT HEAD WITHOUT CONTRAST  CT CERVICAL SPINE WITHOUT CONTRAST  TECHNIQUE: Multidetector CT imaging of the head and cervical spine was performed following the standard protocol without intravenous contrast. Multiplanar CT image reconstructions of the cervical spine were also generated.  COMPARISON:  06/04/2012 head CT  FINDINGS: CT HEAD FINDINGS  Maintained gray-white differentiation. No CT evidence of an acute infarction. No intraparenchymal hemorrhage, mass, mass effect, or abnormal extra-axial fluid collection. The ventricles, cisterns, sulci are normal in size, shape, and position. No displaced calvarial fracture. The visualized paranasal sinuses and mastoid air cells are predominantly clear.  CT CERVICAL SPINE  FINDINGS  Mild biapical scarring and bullae. Maintained craniocervical relationship. No dens fracture. Maintained vertebral body heights. Degenerative disc disease at C5-6 and mild kyphosis at this level disc osteophyte complex at this level results in moderate central canal narrowing. Otherwise, maintained alignment. No acute fracture or dislocation. Paravertebral soft tissues within normal limits.  IMPRESSION: No acute intracranial abnormality.  C5-6 degenerative disc disease. No acute osseous finding of the cervical spine.   Electronically Signed   By: Jearld Lesch M.D.   On: 03/31/2014 03:29   Ct Cervical Spine Wo Contrast  03/31/2014   CLINICAL DATA:  Seizure, fall.  EXAM: CT HEAD WITHOUT CONTRAST  CT CERVICAL SPINE WITHOUT CONTRAST  TECHNIQUE: Multidetector CT imaging of the head and cervical spine was performed following the standard protocol without intravenous contrast. Multiplanar CT image reconstructions of the cervical spine were also generated.  COMPARISON:  06/04/2012 head CT  FINDINGS: CT HEAD FINDINGS  Maintained gray-white differentiation. No CT evidence of an acute infarction. No intraparenchymal hemorrhage, mass, mass effect, or abnormal extra-axial fluid collection. The ventricles, cisterns, sulci are normal in size, shape, and position. No displaced calvarial fracture. The visualized paranasal sinuses and mastoid air cells are predominantly clear.  CT CERVICAL SPINE FINDINGS  Mild biapical scarring and bullae. Maintained craniocervical relationship. No dens fracture. Maintained vertebral body heights. Degenerative disc disease at C5-6 and mild kyphosis at this level disc osteophyte complex at this level results in moderate central canal narrowing. Otherwise, maintained alignment. No acute fracture or dislocation. Paravertebral soft tissues within normal limits.  IMPRESSION: No acute intracranial abnormality.  C5-6 degenerative disc disease. No acute osseous finding of the cervical spine.    Electronically Signed   By: Jearld Lesch M.D.   On: 03/31/2014 03:29     Assessment/Plan: 28 year old male with an episode of confusion, now with lower extremity weakness that is improving.  Event not witnessed but questionably a seizure with post-ictal phenomenon.  Patient has multiple risk factors.  Bilateral lower extremity weakness may suggest a bilateral frontal ictus.  Head CT reviewed and shows no acute changes.    Recommendations 1. CT of the lumbar spine 2.  MRI the brain with and without contrast 3.  Seizure precautions 4.  EEG 5.  Ativan prn without start of anticonvulsant therapy at this time.  6  Serum Magnesium and Posphorus      Thana Farr, MD [facility] [phone] 03/31/2014, 5:00 AM

## 2014-03-31 NOTE — Progress Notes (Signed)
Patient arrived from [facility] with family.  Denied pain at this time.  Safety precaution and orders reviewed with patient and family.  Tele applied and confirmed with [facility].  Will continue to monitor.

## 2014-03-31 NOTE — ED Provider Notes (Signed)
CSN: [id number]     Arrival date & time 03/31/14  0120 History   First MD Initiated Contact with Patient 03/31/14 [phone]     Chief Complaint  Patient presents with  . Seizures     (Consider location/radiation/quality/duration/timing/severity/associated sxs/prior Treatment) HPI Justin [name] is a 28 y.o. male with past medical history of Chrons Disease, ADHD presenting today with a seizure. This was unwitnessed, girlfriend states that she heard him fall off of the couch. When she got to him he was not having any seizure-like activity, however he was confused and slow to speak. She described a post ictal state. Patient states she's had a seizure before but it was 15 years ago. He also has a history of a TBI all in the [employer] a couple years ago. He has had worsening headaches over the past couple of days without any fevers. He states he now has decreased sensation to his feet and his legs, he states he cannot walk nor can he feel them. Patient denies any recent infections, IV drug use, or history of malignancy. Patient has no chest pain or shortness of breath. He has no further complaints.  10 Systems reviewed and are negative for acute change except as noted in the HPI.     Past Medical History  Diagnosis Date  . Crohn's disease   . ADHD (attention deficit hyperactivity disorder)   . Foot fracture, right    Past Surgical History  Procedure Laterality Date  . Gallbladder surgery     No family history on file. History  Substance Use Topics  . Smoking status: Current Every Day Smoker -- 1.00 packs/day    Types: Cigarettes  . Smokeless tobacco: Not on file  . Alcohol Use: 1.8 oz/week    3 Cans of beer per week    Review of Systems    Allergies  Iohexol; Ivp dye; and Nsaids  Home Medications   Prior to Admission medications   Not on File   BP 124/86  Pulse 91  Temp(Src) 97.8 F (36.6 C) (Oral)  Resp 22  Ht 6\' 2"  (1.88 m)  Wt 195 lb (88.451 kg)  BMI 25.03 kg/m2  SpO2  96% Physical Exam  Nursing note and vitals reviewed. Constitutional: He is oriented to person, place, and time. Vital signs are normal. He appears well-developed and well-nourished.  Non-toxic appearance. He does not appear ill. No distress.  HENT:  Head: Normocephalic and atraumatic.  Nose: Nose normal.  Mouth/Throat: Oropharynx is clear and moist. No oropharyngeal exudate.  Eyes: Conjunctivae and EOM are normal. Pupils are equal, round, and reactive to light. No scleral icterus.  Neck: Normal range of motion. Neck supple. No tracheal deviation, no edema, no erythema and normal range of motion present. No mass and no thyromegaly present.  Cardiovascular: Normal rate, regular rhythm, S1 normal, S2 normal, normal heart sounds, intact distal pulses and normal pulses.  Exam reveals no gallop and no friction rub.   No murmur heard. Pulses:      Radial pulses are 2+ on the right side, and 2+ on the left side.       Dorsalis pedis pulses are 2+ on the right side, and 2+ on the left side.  Pulmonary/Chest: Effort normal and breath sounds normal. No respiratory distress. He has no wheezes. He has no rhonchi. He has no rales.  Abdominal: Soft. Normal appearance and bowel sounds are normal. He exhibits no distension, no ascites and no mass. There is no hepatosplenomegaly. There is  tenderness. There is no rebound, no guarding and no CVA tenderness.  Diffuse tenderness to palpation.  Musculoskeletal: Normal range of motion. He exhibits no edema and no tenderness.  Lymphadenopathy:    He has no cervical adenopathy.  Neurological: He is alert and oriented to person, place, and time. He has normal strength. No cranial nerve deficit or sensory deficit. He exhibits abnormal muscle tone. GCS eye subscore is 4. GCS verbal subscore is 5. GCS motor subscore is 6.  Patient has 5 out of 5 strength bilateral upper extremities with normal sensation. Patient has 0 out of 5 strength with no sensation in the bilateral  lower extremities. Finger to nose and rapid alternating hand movements was normal. I did not ambulate the patient due to decreased strength in the lower extremities.  Skin: Skin is warm, dry and intact. No petechiae and no rash noted. He is not diaphoretic. No erythema. No pallor.  Psychiatric: He has a normal mood and affect. His behavior is normal. Judgment normal.    ED Course  Procedures (including critical care time) Labs Review Labs Reviewed  CBC WITH DIFFERENTIAL - Abnormal; Notable for the following:    WBC 11.7 (*)    MCH 34.9 (*)    MCHC 36.5 (*)    Lymphs Abs 4.4 (*)    All other components within normal limits  COMPREHENSIVE METABOLIC PANEL - Abnormal; Notable for the following:    Glucose, Bld 104 (*)    Total Bilirubin <0.2 (*)    Anion gap 19 (*)    All other components within normal limits  URINALYSIS, ROUTINE W REFLEX MICROSCOPIC - Abnormal; Notable for the following:    Specific Gravity, Urine 1.003 (*)    All other components within normal limits  I-STAT CG4 LACTIC ACID, ED - Abnormal; Notable for the following:    Lactic Acid, Venous 2.38 (*)    All other components within normal limits  LIPASE, BLOOD    Imaging Review Dg Chest 2 View  03/31/2014   CLINICAL DATA:  Seizure.  EXAM: CHEST  2 VIEW  COMPARISON:  02/28/2013  FINDINGS: Normal heart size and mediastinal contours. Mildly low lung volumes. No acute infiltrate or edema. No effusion or pneumothorax. No acute osseous findings.  IMPRESSION: No acute findings.   Electronically Signed   By: Tiburcio Pea M.D.   On: 03/31/2014 03:24   Ct Head Wo Contrast  03/31/2014   CLINICAL DATA:  Seizure, fall.  EXAM: CT HEAD WITHOUT CONTRAST  CT CERVICAL SPINE WITHOUT CONTRAST  TECHNIQUE: Multidetector CT imaging of the head and cervical spine was performed following the standard protocol without intravenous contrast. Multiplanar CT image reconstructions of the cervical spine were also generated.  COMPARISON:  06/04/2012  head CT  FINDINGS: CT HEAD FINDINGS  Maintained gray-white differentiation. No CT evidence of an acute infarction. No intraparenchymal hemorrhage, mass, mass effect, or abnormal extra-axial fluid collection. The ventricles, cisterns, sulci are normal in size, shape, and position. No displaced calvarial fracture. The visualized paranasal sinuses and mastoid air cells are predominantly clear.  CT CERVICAL SPINE FINDINGS  Mild biapical scarring and bullae. Maintained craniocervical relationship. No dens fracture. Maintained vertebral body heights. Degenerative disc disease at C5-6 and mild kyphosis at this level disc osteophyte complex at this level results in moderate central canal narrowing. Otherwise, maintained alignment. No acute fracture or dislocation. Paravertebral soft tissues within normal limits.  IMPRESSION: No acute intracranial abnormality.  C5-6 degenerative disc disease. No acute osseous finding of the cervical  spine.   Electronically Signed   By: Jearld LeschAndrew  DelGaizo M.D.   On: 03/31/2014 03:29   Ct Cervical Spine Wo Contrast  03/31/2014   CLINICAL DATA:  Seizure, fall.  EXAM: CT HEAD WITHOUT CONTRAST  CT CERVICAL SPINE WITHOUT CONTRAST  TECHNIQUE: Multidetector CT imaging of the head and cervical spine was performed following the standard protocol without intravenous contrast. Multiplanar CT image reconstructions of the cervical spine were also generated.  COMPARISON:  06/04/2012 head CT  FINDINGS: CT HEAD FINDINGS  Maintained gray-white differentiation. No CT evidence of an acute infarction. No intraparenchymal hemorrhage, mass, mass effect, or abnormal extra-axial fluid collection. The ventricles, cisterns, sulci are normal in size, shape, and position. No displaced calvarial fracture. The visualized paranasal sinuses and mastoid air cells are predominantly clear.  CT CERVICAL SPINE FINDINGS  Mild biapical scarring and bullae. Maintained craniocervical relationship. No dens fracture. Maintained  vertebral body heights. Degenerative disc disease at C5-6 and mild kyphosis at this level disc osteophyte complex at this level results in moderate central canal narrowing. Otherwise, maintained alignment. No acute fracture or dislocation. Paravertebral soft tissues within normal limits.  IMPRESSION: No acute intracranial abnormality.  C5-6 degenerative disc disease. No acute osseous finding of the cervical spine.   Electronically Signed   By: Jearld LeschAndrew  DelGaizo M.D.   On: 03/31/2014 03:29     EKG Interpretation   Date/Time:  Thursday March 31 2014 02:33:02 EDT Ventricular Rate:  94 PR Interval:  162 QRS Duration: 78 QT Interval:  351 QTC Calculation: 439 R Axis:   13 Text Interpretation:  Sinus rhythm Abnormal R-wave progression, early  transition No significant change since last tracing Confirmed by Erroll Lunani,  Elba Schaber Ayokunle [phone]([id number]) on 03/31/2014 3:16:39 AM      MDM   Final diagnoses:  Seizure    Patient presents to emergency department for likely seizure as girlfriend describes a postictal state. He now has loss of sensation in his bilateral lower extremities. Will evaluate with blood work, infectious workup, and CT scan of the head. Patient will need neurological consultation and likely admission to the hospital. He was given Tylenol Reglan and Benadryl for his headache.  Neurology evaluated the patient and rec for MRI of head and entire spine, EEG.  Patient admitted to hospitalists service, telemetry.    Tomasita CrumbleAdeleke Laquita Harlan, MD 03/31/14 [phone]

## 2014-03-31 NOTE — Progress Notes (Signed)
Patient arrived to floor from ED with girlfriend, noted is large slightly risen bump on patient's forehead over on left medial anterior side of head. Patient is alert and oriented. Seizures precautions in place. Suction set up for patient. Patient assessed and vital signs taken. Patient declined gown. Patient stated that he had numbness in legs, couldn't  move them, nor did he feel nor respond to painful stimuli. Notified Admissions to page attending.

## 2014-03-31 NOTE — Progress Notes (Signed)
Patient ID: Justin NidaJustin M [name]  male  ONG:[id number]    DOB: 10/26/1985    DOA: 03/31/2014  PCP: Justin [name], Samuel, MD  History of present illness Briefly patient is a 28 year old male with history of Crohn's disease, ADHD, admitted this morning with altered mental status and lower extremity weakness. Patient was found by his girlfriend on the floor, looked confused, unable to move his legs otherwise no witnessed seizure, tongue biting or bowel/bladder incontinence. Patient also had been reporting headaches in the last 10 days with associated nausea, photophobia and phonophobia. Neurology consulted and recommended MRI of the brain, EEG, seizure precaution Called by flow RN to see patient for LE weakness   Assessment/Plan: Principal Problem:   Syncope and collapse versus seizure presenting with lower extremity weakness and confusion, possible complicated migraine - Patient seen and examined, currently alert and oriented x3, friend and girlfriend at the bedside, complaining of headaches - Imagings reviewed, CT head showed no intracranial abnormality - CT C-spine showed C5 and 6 degenerative disc disease - CT of the lumbar spine showed mild diffuse degenerative disc bulge at L5-S1 - Patient was seen by neurology this morning, recommended MRI of the brain, EEG, also had a 2-D echo as a part of syncope workup - Continue serial neuro checks, seizure precautions, added Ativan  Active Problems: Lactic acidosis: - Continue IV fluid hydration    Migraine - Continue Reglan and oxycodone    Crohn disease: Currently stable  DVT Prophylaxis:  Code Status:  Family Communication: Discussed with patient, girlfriend and friend at the bedside  Disposition: Transfer to neuro floor  Consultants:  Neurology  Procedures:  CT head, C-spine, lumbar spine  Antibiotics:  None    Subjective: Patient seen and examined, no acute changes from admission  Objective: Weight change:    Intake/Output Summary (Last 24 hours) at 03/31/14 0806 Last data filed at 03/31/14 0240  Gross per 24 hour  Intake      0 ml  Output    900 ml  Net   -900 ml   Blood pressure 105/67, pulse 78, temperature 98 F (36.7 C), temperature source Oral, resp. rate 20, height 6\' 2"  (1.88 m), weight 88.6 kg (195 lb 5.2 oz), SpO2 97.00%.  Physical Exam: General: Alert and awake, oriented x3, not in any acute distress. HEENT: anicteric sclera, PERLA, EOMI CVS: S1-S2 clear, no murmur rubs or gallops Chest: clear to auscultation bilaterally, no wheezing, rales or rhonchi Abdomen: soft nontender, nondistended, normal bowel sounds  Extremities: no cyanosis, clubbing or edema noted bilaterally Neuro: No cranial nerves deficits, upper extremities 5/5 strength, lower extremities 3/5, patient is able to lift up the legs up to more than 2 inches  Lab Results: Basic Metabolic Panel:  Recent Labs Lab 03/31/14 0230  NA 144  K 4.0  CL 103  CO2 22  GLUCOSE 104*  BUN 8  CREATININE 1.04  CALCIUM 9.3   Liver Function Tests:  Recent Labs Lab 03/31/14 0230  AST 21  ALT 33  ALKPHOS 59  BILITOT <0.2*  PROT 7.2  ALBUMIN 4.0    Recent Labs Lab 03/31/14 0230  LIPASE 24   No results found for this basename: AMMONIA,  in the last 168 hours CBC:  Recent Labs Lab 03/31/14 0230  WBC 11.7*  NEUTROABS 6.4  HGB 17.0  HCT 46.6  MCV 95.7  PLT 266   Cardiac Enzymes: No results found for this basename: CKTOTAL, CKMB, CKMBINDEX, TROPONINI,  in the last 168 hours BNP: No  components found with this basename: POCBNP,  CBG: No results found for this basename: GLUCAP,  in the last 168 hours   Micro Results: No results found for this or any previous visit (from the past 240 hour(s)).  Studies/Results: Dg Chest 2 View  03/31/2014   CLINICAL DATA:  Seizure.  EXAM: CHEST  2 VIEW  COMPARISON:  02/28/2013  FINDINGS: Normal heart size and mediastinal contours. Mildly low lung volumes. No acute  infiltrate or edema. No effusion or pneumothorax. No acute osseous findings.  IMPRESSION: No acute findings.   Electronically Signed   By: Tiburcio Pea M.D.   On: 03/31/2014 03:24   Ct Head Wo Contrast  03/31/2014   CLINICAL DATA:  Seizure, fall.  EXAM: CT HEAD WITHOUT CONTRAST  CT CERVICAL SPINE WITHOUT CONTRAST  TECHNIQUE: Multidetector CT imaging of the head and cervical spine was performed following the standard protocol without intravenous contrast. Multiplanar CT image reconstructions of the cervical spine were also generated.  COMPARISON:  06/04/2012 head CT  FINDINGS: CT HEAD FINDINGS  Maintained gray-white differentiation. No CT evidence of an acute infarction. No intraparenchymal hemorrhage, mass, mass effect, or abnormal extra-axial fluid collection. The ventricles, cisterns, sulci are normal in size, shape, and position. No displaced calvarial fracture. The visualized paranasal sinuses and mastoid air cells are predominantly clear.  CT CERVICAL SPINE FINDINGS  Mild biapical scarring and bullae. Maintained craniocervical relationship. No dens fracture. Maintained vertebral body heights. Degenerative disc disease at C5-6 and mild kyphosis at this level disc osteophyte complex at this level results in moderate central canal narrowing. Otherwise, maintained alignment. No acute fracture or dislocation. Paravertebral soft tissues within normal limits.  IMPRESSION: No acute intracranial abnormality.  C5-6 degenerative disc disease. No acute osseous finding of the cervical spine.   Electronically Signed   By: Jearld Lesch M.D.   On: 03/31/2014 03:29   Ct Cervical Spine Wo Contrast  03/31/2014   CLINICAL DATA:  Seizure, fall.  EXAM: CT HEAD WITHOUT CONTRAST  CT CERVICAL SPINE WITHOUT CONTRAST  TECHNIQUE: Multidetector CT imaging of the head and cervical spine was performed following the standard protocol without intravenous contrast. Multiplanar CT image reconstructions of the cervical spine were  also generated.  COMPARISON:  06/04/2012 head CT  FINDINGS: CT HEAD FINDINGS  Maintained gray-white differentiation. No CT evidence of an acute infarction. No intraparenchymal hemorrhage, mass, mass effect, or abnormal extra-axial fluid collection. The ventricles, cisterns, sulci are normal in size, shape, and position. No displaced calvarial fracture. The visualized paranasal sinuses and mastoid air cells are predominantly clear.  CT CERVICAL SPINE FINDINGS  Mild biapical scarring and bullae. Maintained craniocervical relationship. No dens fracture. Maintained vertebral body heights. Degenerative disc disease at C5-6 and mild kyphosis at this level disc osteophyte complex at this level results in moderate central canal narrowing. Otherwise, maintained alignment. No acute fracture or dislocation. Paravertebral soft tissues within normal limits.  IMPRESSION: No acute intracranial abnormality.  C5-6 degenerative disc disease. No acute osseous finding of the cervical spine.   Electronically Signed   By: Jearld Lesch M.D.   On: 03/31/2014 03:29   Ct Lumbar Spine Wo Contrast  03/31/2014   CLINICAL DATA:  Initial evaluation for inability to move legs. Recent seizure.  EXAM: CT LUMBAR SPINE WITHOUT CONTRAST  TECHNIQUE: Multidetector CT imaging of the lumbar spine was performed without intravenous contrast administration. Multiplanar CT image reconstructions were also generated.  COMPARISON:  Prior study from 11/10/2013.  FINDINGS: The vertebral bodies are normally aligned with  preservation of the normal lumbar lordosis. Vertebral body heights are well preserved. No acute fracture or listhesis.  Paraspinous soft tissues are within normal limits. The visualized visceral structures are unremarkable.  Mild degenerative disc bulge present at L5-S1 without significant stenosis. Otherwise, no significant degenerative changes seen within the lumbar spine.  IMPRESSION: 1. No acute fracture or listhesis within the lumbar  spine. 2. Mild diffuse degenerative disc bulge at L5-S1 without significant stenosis. No other significant degenerative changes within the lumbar spine. No evidence for cord compression.   Electronically Signed   By: Rise Mu M.D.   On: 03/31/2014 06:42    Medications: Scheduled Meds:     LOS: 0 days   RAI,RIPUDEEP M.D. [facility] 03/31/2014, 8:06 AM Pager: [phone]  If 7PM-7AM, please contact night-coverage [email or web address] Password [email or web address]

## 2014-03-31 NOTE — ED Notes (Signed)
Pt requesting medication for his HA. MD informed.

## 2014-03-31 NOTE — H&P (Signed)
[facility] Admission History and Physical       DAVIONTE LUSBY ZOX:[id number] DOB: [birth year] DOA: 03/31/2014  Referring physician: EDP PCP: Kevin Fenton, MD  Specialists:   Chief Complaint: Passed Out  HPI: Justin [name] is a 28 y.o. male with a history of Crohn Disease, ADHD , and Migraine Headaches who presents to the ED after his girlfriend heard him fall to the floor and went and found him on the floor unconscious.  He came around shortly thereafter and was confused and reported having weakness and numbness in both legs and was not able to stand or walk.   He was not observed to have any tremors or convulsions or loss of bowel or bladder function.   Per his brother,  he had had a similar episode years ago.   EMS was called and he was transported to the ED.     He reports that he has had a severe migraine headache daily for the past 10 days.  He reports that his usual; headaches are from the back of his head forward, but he has been having headaches behind one eye one day then the other eye the next day.   He also reports that he drinks 1-2 beers daily.    He was evaluated in the ED and seen by Neuro and referred for further workup.      Review of Systems:  Constitutional: No Weight Loss, No Weight Gain, Night Sweats, Fevers, Chills, Dizziness, Fatigue, or Generalized Weakness HEENT:  +Headaches, Difficulty Swallowing,Tooth/Dental Problems,Sore Throat,  No Sneezing, Rhinitis, Ear Ache, Nasal Congestion, or Post Nasal Drip,  Cardio-vascular:  No Chest pain, Orthopnea, PND, Edema in Lower Extremities, Anasarca, Dizziness, Palpitations  Resp: No Dyspnea, No DOE, No Cough, No Hemoptysis, No Wheezing.    GI: No Heartburn, Indigestion, Abdominal Pain, Nausea, Vomiting, Diarrhea, Hematemesis, Hematochezia, Melena, Change in Bowel Habits,  Loss of Appetite  GU: No Dysuria, Change in Color of Urine, No Urgency or Frequency, No Flank pain.  Musculoskeletal: No Joint Pain or  Swelling, No Decreased Range of Motion, No Back Pain.  Neurologic: +Syncope, Seizure, +Lower Leg Muscle Weakness and Paresthesia, Vision Disturbance or Loss, No Diplopia, No Vertigo, No Difficulty Walking,  Skin: No Rash or Lesions. Psych: No Change in Mood or Affect, No Depression or Anxiety, No Memory loss, No Confusion, or Hallucinations   Past Medical History  Diagnosis Date  . Crohn's disease   . ADHD (attention deficit hyperactivity disorder)   . Foot fracture, right     Past Surgical History  Procedure Laterality Date  . Gallbladder surgery        Prior to Admission medications   Not on File     Allergies  Allergen Reactions  . Iohexol Anaphylaxis    Pt had 1st sensitivity to Iohexol with periorbital swelling. His reaction during scan 2 days later was throat swelling, respiratory distress; admitted to ICU  . Ivp Dye [Iodinated Diagnostic Agents]   . Nsaids     Crohns     Social History:  reports that he has been smoking Cigarettes.  He has been smoking about 1.00 pack per day. He does not have any smokeless tobacco history on file. He reports that he drinks about 1.8 ounces of alcohol per week. He reports that he does not use illicit drugs.     No family history on file.     Physical Exam:  GEN:  Pleasant Well Nourished and Well Developed 28 y.o. Caucasian male  examined and in no acute distress; cooperative with exam Filed Vitals:   03/31/14 0530 03/31/14 0545 03/31/14 0625 03/31/14 0647  BP: 119/83 110/75 106/62 105/67  Pulse: 81 78 84 78  Temp:    98 F (36.7 C)  TempSrc:    Oral  Resp: 20 16 20 20   Height:    6\' 2"  (1.88 m)  Weight:    88.6 kg (195 lb 5.2 oz)  SpO2: 97% 97% 96% 97%   Blood pressure 105/67, pulse 78, temperature 98 F (36.7 C), temperature source Oral, resp. rate 20, height 6\' 2"  (1.88 m), weight 88.6 kg (195 lb 5.2 oz), SpO2 97.00%. PSYCH: He is alert and oriented x4; does not appear anxious does not appear depressed; affect is  normal HEENT: Normocephalic and Atraumatic, Mucous membranes pink; PERRLA; EOM intact; Fundi:  Benign;  No scleral icterus, Nares: Patent, Oropharynx: Clear,  Fair Dentition,    Neck:  FROM, No Cervical Lymphadenopathy nor Thyromegaly or Carotid Bruit; No JVD; Breasts:: Not examined CHEST WALL: No tenderness CHEST: Normal respiration, clear to auscultation bilaterally HEART: Regular rate and rhythm; no murmurs rubs or gallops BACK: No kyphosis or scoliosis; No CVA tenderness ABDOMEN: Positive Bowel Sounds, Soft Non-Tender; No Masses, No Organomegaly. Rectal Exam: Not done EXTREMITIES: No  Cyanosis, Clubbing, or Edema; No Ulcerations. Genitalia: not examined PULSES: 2+ and symmetric SKIN: Normal hydration no rash or ulceration  CNS:   Mental Status:  Alert, Oriented, Thought Content Appropriate. Speech Fluent without evidence of Aphasia. Able to follow 3 step commands without difficulty.  In No obvious pain.   Cranial Nerves:  II: Discs flat bilaterally; Visual fields Intact, Pupils equal and reactive.    III,IV, VI: Extra-ocular motions intact bilaterally    V,VII: smile symmetric, facial light touch sensation normal bilaterally    VIII: hearing intact bilaterally    IX,X: gag reflex present    XI: bilateral shoulder shrug    XII: midline tongue extension   Motor:  Right:  Upper extremity 5/5     Left:  Upper extremity 5/5     Right:  Lower extremity 5/5    Left:  Lower extremity 5/5     Tone and Bulk:  normal tone throughout; no atrophy noted   Sensory:  Pinprick and light touch intact throughout, bilaterally   Deep Tendon Reflexes: 2+ and symmetric throughout   Plantars/ Babinski:  Right: normal Left: normal    Cerebellar:  Finger to nose without difficulty.   Gait: deferred   Vascular: pulses palpable throughout    Labs on Admission:  Basic Metabolic Panel:  Recent Labs Lab 03/31/14 0230  NA 144  K 4.0  CL 103  CO2 22  GLUCOSE 104*  BUN 8  CREATININE 1.04    CALCIUM 9.3   Liver Function Tests:  Recent Labs Lab 03/31/14 0230  AST 21  ALT 33  ALKPHOS 59  BILITOT <0.2*  PROT 7.2  ALBUMIN 4.0    Recent Labs Lab 03/31/14 0230  LIPASE 24   No results found for this basename: AMMONIA,  in the last 168 hours CBC:  Recent Labs Lab 03/31/14 0230  WBC 11.7*  NEUTROABS 6.4  HGB 17.0  HCT 46.6  MCV 95.7  PLT 266   Cardiac Enzymes: No results found for this basename: CKTOTAL, CKMB, CKMBINDEX, TROPONINI,  in the last 168 hours  BNP (last 3 results) No results found for this basename: PROBNP,  in the last 8760 hours CBG: No results found for this  basename: GLUCAP,  in the last 168 hours  Radiological Exams on Admission: Dg Chest 2 View  03/31/2014   CLINICAL DATA:  Seizure.  EXAM: CHEST  2 VIEW  COMPARISON:  02/28/2013  FINDINGS: Normal heart size and mediastinal contours. Mildly low lung volumes. No acute infiltrate or edema. No effusion or pneumothorax. No acute osseous findings.  IMPRESSION: No acute findings.   Electronically Signed   By: Tiburcio Pea M.D.   On: 03/31/2014 03:24   Ct Head Wo Contrast  03/31/2014   CLINICAL DATA:  Seizure, fall.  EXAM: CT HEAD WITHOUT CONTRAST  CT CERVICAL SPINE WITHOUT CONTRAST  TECHNIQUE: Multidetector CT imaging of the head and cervical spine was performed following the standard protocol without intravenous contrast. Multiplanar CT image reconstructions of the cervical spine were also generated.  COMPARISON:  06/04/2012 head CT  FINDINGS: CT HEAD FINDINGS  Maintained gray-white differentiation. No CT evidence of an acute infarction. No intraparenchymal hemorrhage, mass, mass effect, or abnormal extra-axial fluid collection. The ventricles, cisterns, sulci are normal in size, shape, and position. No displaced calvarial fracture. The visualized paranasal sinuses and mastoid air cells are predominantly clear.  CT CERVICAL SPINE FINDINGS  Mild biapical scarring and bullae. Maintained craniocervical  relationship. No dens fracture. Maintained vertebral body heights. Degenerative disc disease at C5-6 and mild kyphosis at this level disc osteophyte complex at this level results in moderate central canal narrowing. Otherwise, maintained alignment. No acute fracture or dislocation. Paravertebral soft tissues within normal limits.  IMPRESSION: No acute intracranial abnormality.  C5-6 degenerative disc disease. No acute osseous finding of the cervical spine.   Electronically Signed   By: Jearld Lesch M.D.   On: 03/31/2014 03:29   Ct Cervical Spine Wo Contrast  03/31/2014   CLINICAL DATA:  Seizure, fall.  EXAM: CT HEAD WITHOUT CONTRAST  CT CERVICAL SPINE WITHOUT CONTRAST  TECHNIQUE: Multidetector CT imaging of the head and cervical spine was performed following the standard protocol without intravenous contrast. Multiplanar CT image reconstructions of the cervical spine were also generated.  COMPARISON:  06/04/2012 head CT  FINDINGS: CT HEAD FINDINGS  Maintained gray-white differentiation. No CT evidence of an acute infarction. No intraparenchymal hemorrhage, mass, mass effect, or abnormal extra-axial fluid collection. The ventricles, cisterns, sulci are normal in size, shape, and position. No displaced calvarial fracture. The visualized paranasal sinuses and mastoid air cells are predominantly clear.  CT CERVICAL SPINE FINDINGS  Mild biapical scarring and bullae. Maintained craniocervical relationship. No dens fracture. Maintained vertebral body heights. Degenerative disc disease at C5-6 and mild kyphosis at this level disc osteophyte complex at this level results in moderate central canal narrowing. Otherwise, maintained alignment. No acute fracture or dislocation. Paravertebral soft tissues within normal limits.  IMPRESSION: No acute intracranial abnormality.  C5-6 degenerative disc disease. No acute osseous finding of the cervical spine.   Electronically Signed   By: Jearld Lesch M.D.   On: 03/31/2014  03:29   Ct Lumbar Spine Wo Contrast  03/31/2014   CLINICAL DATA:  Initial evaluation for inability to move legs. Recent seizure.  EXAM: CT LUMBAR SPINE WITHOUT CONTRAST  TECHNIQUE: Multidetector CT imaging of the lumbar spine was performed without intravenous contrast administration. Multiplanar CT image reconstructions were also generated.  COMPARISON:  Prior study from 11/10/2013.  FINDINGS: The vertebral bodies are normally aligned with preservation of the normal lumbar lordosis. Vertebral body heights are well preserved. No acute fracture or listhesis.  Paraspinous soft tissues are within normal limits. The visualized visceral  structures are unremarkable.  Mild degenerative disc bulge present at L5-S1 without significant stenosis. Otherwise, no significant degenerative changes seen within the lumbar spine.  IMPRESSION: 1. No acute fracture or listhesis within the lumbar spine. 2. Mild diffuse degenerative disc bulge at L5-S1 without significant stenosis. No other significant degenerative changes within the lumbar spine. No evidence for cord compression.   Electronically Signed   By: Rise Mu M.D.   On: 03/31/2014 06:42     EKG: Independently reviewed. Normal Sinus Rhythm rate = 94   Assessment/Plan:   28 y.o. male with   Principal Problem:   1.   Syncope and collapse versus Seizures   Seizure Precautions   EEG ordered   MRI of Brain   Neuro Saw in ED     2.   Lower extremity weakness- Possible Complex Migraine   MRI ordered     3.   Migraine-    Pain control PRN     4.   Crohn disease- No recent flares   Stable    5.   DVT prophylaxis   SCDs       Code Status:   FULL CODE Family Communication:  Family at bedside Disposition Plan:    Observation     Time spent:  48 Minutes   Ron Parker [facility] Pager [phone]   If 7AM -7PM Please Contact the Day Rounding Team MD for [facility]  If 7PM-7AM, Please Contact Night-Floor Coverage    [email or web address] Password [email or web address] 03/31/2014, 7:22 AM

## 2014-03-31 NOTE — Progress Notes (Signed)
Echo Lab  2D Echocardiogram completed.  Avrianna Smart L [place], RDCS 03/31/2014 2:43 PM

## 2014-03-31 NOTE — Progress Notes (Signed)
PT Cancellation Note  Patient Details Name: Justin [name] MRN: [id number] DOB: 1986/02/19   Cancelled Treatment:    Reason Eval/Treat Not Completed: Patient at procedure or test/unavailable. Pt not in room and family reports gone for 2D echo.   Thatiana Renbarger 03/31/2014, 3:22 PM Pager [phone]

## 2014-03-31 NOTE — Progress Notes (Signed)
Patient c/o his left foot going to sleep then progress to the right foot. Positive +2 pulse present. Able to wiggle toes. After a few brief second, he verbalized that the sensation returned on both foot. Dr. Isidoro Donning paged through [facility]. Will continue to monitor.  Sim Boast, RN

## 2014-03-31 NOTE — Progress Notes (Signed)
Occupational Therapy Evaluation Patient Details Name: Justin [name] MRN: [id number] DOB: March 16, 1986 Today's Date: 03/31/2014    History of Present Illness Klein Stablein. Carlock is a 28 y.o. Male admitted 03/31/14 after syncopal episode at home. Upon arousal, pt was confused and c/o weakness and numbness in Bil LEs.    Clinical Impression   PTA pt lived at home with his girlfriend and was independent with ADLs. Pt currently presents with Bil LE weakness and deferred OOB at this time due to safety concerns. Safe d/c plan will be updated following mobility assessment during next OT session. Pt previously with similar episode years ago that resolved quickly. Pt will benefit from acute OT to address safety during ADLs.     Follow Up Recommendations  Other (comment) (TBD based on mobility)    Equipment Recommendations  Other (comment) (TBD)    Recommendations for Other Services       Precautions / Restrictions Precautions Precautions: Fall Precaution Comments: syncope Restrictions Weight Bearing Restrictions: No      Mobility Bed Mobility               General bed mobility comments: Deferred at this time. Pt has HA and Bil LE weakness.   Transfers                 General transfer comment: Deferred at this time due to safety. Pt with Bil LE weakness and requires effort to move LEs minimally. Will assess mobility during next session.          ADL Overall ADL's : Needs assistance/impaired Eating/Feeding: Independent;Sitting   Grooming: Set up;Sitting   Upper Body Bathing: Set up;Sitting       Upper Body Dressing : Set up;Sitting                     General ADL Comments: Pt resting in bed and c/o weakness in LEs. Pt able to move Bil LEs minimally with effort and unable to wiggle Bil toes. Pt reports intact sensation (previously numbness) in Bil feet to light touch. Deferred OOB at this time due to weakness and safety concerns. Pt with hx of similar  episode "many years ago" which resolved however pt unsure how quickly it resolved last time. OT will plan to follow up to assess mobility.      Vision  Pt reports no change from baseline. Does report eye strain, HA behind eyes as part of migraine (this is normal for him).                    Perception Perception Perception Tested?: No   Praxis Praxis Praxis tested?: Within functional limits    Pertinent Vitals/Pain Pain Assessment: 0-10 Pain Score: 4  Pain Location: headache Pain Descriptors / Indicators:  ("more nauseous") Pain Intervention(s): Limited activity within patient's tolerance;Monitored during session;Other (comment) (pt requesting nausea meds)     Hand Dominance Right   Extremity/Trunk Assessment Upper Extremity Assessment Upper Extremity Assessment: Overall WFL for tasks assessed   Lower Extremity Assessment Lower Extremity Assessment: Generalized weakness;Defer to PT evaluation   Cervical / Trunk Assessment Cervical / Trunk Assessment: Normal   Communication Communication Communication: No difficulties   Cognition Arousal/Alertness: Awake/alert Behavior During Therapy: WFL for tasks assessed/performed Overall Cognitive Status: Within Functional Limits for tasks assessed  Home Living Family/patient expects to be discharged to:: Private residence Living Arrangements: Spouse/significant other Available Help at Discharge: Family;Available PRN/intermittently Type of Home: Apartment Home Access: Stairs to enter Entrance Stairs-Number of Steps: 3 flights   Home Layout: One level     Bathroom Shower/Tub: Tub/shower unit Shower/tub characteristics: Curtain       Home Equipment: None          Prior Functioning/Environment Level of Independence: Independent        Comments: Pt is a Consulting civil engineerstudent at [employer] and plans to attend [employer] for OT school.     OT Diagnosis: Generalized weakness;Acute pain   OT  Problem List: Decreased strength;Decreased activity tolerance;Impaired balance (sitting and/or standing);Decreased knowledge of precautions;Pain   OT Treatment/Interventions: Self-care/ADL training;Therapeutic exercise;Energy conservation;DME and/or AE instruction;Therapeutic activities;Patient/family education;Balance training    OT Goals(Current goals can be found in the care plan section) Acute Rehab OT Goals Patient Stated Goal: to get my strength back OT Goal Formulation: With patient Time For Goal Achievement: 04/14/14 Potential to Achieve Goals: Good ADL Goals Pt Will Perform Grooming: with modified independence;standing Pt Will Perform Lower Body Bathing: with modified independence;sit to/from stand Pt Will Perform Lower Body Dressing: with modified independence;sit to/from stand Pt Will Transfer to Toilet: with modified independence;ambulating Pt Will Perform Toileting - Clothing Manipulation and hygiene: with modified independence;sit to/from stand  OT Frequency: Min 2X/week              End of Session Nurse Communication: Other (comment) (pt requesting meds for nausea)  Activity Tolerance: Patient limited by pain;Patient limited by fatigue Patient left: in bed;with call bell/phone within reach;with bed alarm set;with family/visitor present   Time: [id number] OT Time Calculation (min): 10 min Charges:  OT General Charges $OT Visit: 1 Procedure OT Evaluation $Initial OT Evaluation Tier I: 1 Procedure G-Codes: OT G-codes **NOT FOR INPATIENT CLASS** Functional Assessment Tool Used: clinical judgement Functional Limitation: Self care Self Care Current Status (U1324(G8987): At least 1 percent but less than 20 percent impaired, limited or restricted Self Care Goal Status (M0102(G8988): 0 percent impaired, limited or restricted  Rae LipsMiller, Pavan Bring M 03/31/2014, 6:09 PM  Carney LivingLeeAnn Marie Jere Bostrom, OTR/L Occupational Therapist [phone] (pager)

## 2014-03-31 NOTE — Progress Notes (Signed)
EEG Completed; Results Pending  

## 2014-03-31 NOTE — ED Notes (Addendum)
Pt comes from home via [facility], went out with girlfriend for pizza and a few drinks, when returning home girlfriend heard pt hit the floor and saw hit shaking. Pt has hx of one seizure when he was 28 years old. No urinary incontinence, no blood in mouth.

## 2014-03-31 NOTE — Progress Notes (Signed)
Called report and answered all questions. Pt being transferred to 4N in bed on telemetry.

## 2014-03-31 NOTE — Progress Notes (Signed)
Paged provider on call to get admission orders.

## 2014-03-31 NOTE — Procedures (Signed)
History:  Sedation:   Technique: This is a 17 channel routine scalp EEG performed at the bedside with bipolar and monopolar montages arranged in accordance to the international 10/20 system of electrode placement. One channel was dedicated to EKG recording.    Background: The background consists of intermixed alpha and beta activities. There is a well defined posterior dominant rhythm of 9.5 Hz that attenuates with eye opening. There is anterior shifting of the PDR with drowsiness, but no sleep was recorded.   Photic stimulation: Physiologic driving is present  EEG Abnormalities: None  Clinical Interpretation: This normal EEG is recorded in the waking and drowsystate. There was no seizure or seizure predisposition recorded on this study.   Ritta Slot, MD [facility] [phone]  If 7pm- 7am, please page neurology on call as listed in AMION.

## 2014-03-31 NOTE — Progress Notes (Signed)
UR completed 

## 2014-04-01 LAB — VITAMIN B12: Vitamin B-12: 496 pg/mL (ref 211–911)

## 2014-04-01 LAB — CBC
HEMATOCRIT: 44.2 % (ref 39.0–52.0)
HEMOGLOBIN: 14.9 g/dL (ref 13.0–17.0)
MCH: 33.9 pg (ref 26.0–34.0)
MCHC: 33.7 g/dL (ref 30.0–36.0)
MCV: 100.5 fL — AB (ref 78.0–100.0)
Platelets: 225 10*3/uL (ref 150–400)
RBC: 4.4 MIL/uL (ref 4.22–5.81)
RDW: 12.7 % (ref 11.5–15.5)
WBC: 8.1 10*3/uL (ref 4.0–10.5)

## 2014-04-01 LAB — BASIC METABOLIC PANEL
ANION GAP: 9 (ref 5–15)
BUN: 9 mg/dL (ref 6–23)
CALCIUM: 8.6 mg/dL (ref 8.4–10.5)
CO2: 28 meq/L (ref 19–32)
Chloride: 104 mEq/L (ref 96–112)
Creatinine, Ser: 1.15 mg/dL (ref 0.50–1.35)
GFR calc Af Amer: >90 mL/min (ref >90)
GFR calc non Af Amer: 85 mL/min — ABNORMAL LOW (ref >90)
Glucose, Bld: 98 mg/dL (ref 70–99)
Potassium: 4.7 mEq/L (ref 3.7–5.3)
Sodium: 141 mEq/L (ref 137–147)

## 2014-04-01 MED ORDER — VITAMIN B-1 100 MG PO TABS
100.0000 mg | ORAL_TABLET | Freq: Every day | ORAL | Status: DC
  Administered 2014-04-01 – 2014-04-02 (×2): 100 mg via ORAL
  Filled 2014-04-01 (×2): qty 1

## 2014-04-01 MED ORDER — VITAMIN B-1 100 MG PO TABS: 100.0000 mg | ORAL_TABLET | Freq: Every day | ORAL | Status: DC

## 2014-04-01 NOTE — Progress Notes (Signed)
Talked to patient about outpatient rehab choices, patient chose [facility] for Outpatient Physical Therapy; orders faxed as requested; The Outpatient Rehab will contact the patient at home at discharge with a start up date and time of therapy; Alexis Goodell [phone]

## 2014-04-01 NOTE — Evaluation (Signed)
Physical Therapy Evaluation Patient Details Name: Justin NidaJustin M [name] MRN: [id number] DOB: [birth year] Today's Date: 04/01/2014   History of Present Illness  Nolon BussingJustin M. Vear Clockhillips is a 28 y.o. Male admitted 03/31/14 after syncopal episode at home. Upon arousal, pt was confused and c/o weakness and numbness in Bil LEs. MRI brain, CT C-spine and lumbar spine normal, Magnesium and phosphorus normal. EEG showed no epileptiform activity.  Clinical Impression  Pt admitted with syncope with fall and bil LE weakness (currently of unknown etiology). Pt functionally demonstrating more leg strength than he demonstrates with manual muscle testing (ex. Demonstrates 3+ hip and knee extension with testing however able to slowly lower himself to sitting in chair). Isometrically demonstrates 5/5 hip and knee strength. Only able to ambulate 40 ft with RW and incr support through bil UEs (with tremulous legs and then arms after initial 20 ft). Pt currently with functional limitations due to the deficits listed below (see PT Problem List).  Pt will benefit from skilled PT to increase their independence and safety with mobility to allow discharge to the venue listed below.       Follow Up Recommendations Outpatient PT;Supervision for mobility/OOB    Equipment Recommendations  Rolling walker with 5" wheels    Recommendations for Other Services       Precautions / Restrictions Precautions Precautions: Fall Precaution Comments: syncope with fall      Mobility  Bed Mobility Overal bed mobility: Modified Independent             General bed mobility comments: HOB flat and no rail; extrememly effortful supine to sit EOB, however no assist or cues needed  Transfers Overall transfer level: Needs assistance Equipment used: Rolling walker (2 wheeled) Transfers: Sit to/from Stand Sit to Stand: Min guard         General transfer comment: x2 with vc for safe use of RW and sequencing; pt moves slowly to ascend  (demonstrating hip and knee extension more than during manual muscle testing)  Ambulation/Gait Ambulation/Gait assistance: Min guard Ambulation Distance (Feet): 40 Feet (10 ft, seated rest 40 ft) Assistive device: Rolling walker (2 wheeled) Gait Pattern/deviations: Step-through pattern;Decreased stride length;Wide base of support   Gait velocity interpretation: Below normal speed for age/gender General Gait Details: incr reliance on bil UEs for support; advancing legs independently with no shuffle or foot drop; as fatigued his arms and legs became tremulous  Stairs            Wheelchair Mobility    Modified Rankin (Stroke Patients Only)       Balance Overall balance assessment: Needs assistance Sitting-balance support: No upper extremity supported;Feet supported Sitting balance-Leahy Scale: Good     Standing balance support: Single extremity supported Standing balance-Leahy Scale: Poor Standing balance comment: does not feel he can stand without at least unilateral UE support                             Pertinent Vitals/Pain Pain Assessment: 0-10 Pain Score: 4  Pain Location: headache Pain Intervention(s): Limited activity within patient's tolerance;Monitored during session;Premedicated before session    Home Living Family/patient expects to be discharged to:: Private residence Living Arrangements: Spouse/significant other (girlfriend) Available Help at Discharge: Family;Friend(s);Available 24 hours/day (can provide 24/7 short-term) Type of Home: House (mother's house) Home Access: Stairs to enter Entrance Stairs-Rails: Right;Left;Can reach both Entrance Stairs-Number of Steps: 4 Home Layout: One level Home Equipment: None      Prior  Function Level of Independence: Independent         Comments: Pt is a Consulting civil engineer at [employer] and plans to attend [employer] for OT school.      Hand Dominance   Dominant Hand: Right    Extremity/Trunk Assessment   Upper  Extremity Assessment: Overall WFL for tasks assessed (became tremulous with use of RW)           Lower Extremity Assessment: RLE deficits/detail;LLE deficits/detail RLE Deficits / Details: bradykinesia; isometrically 5/5 hips (all planes) however when asked to unilateral bridge, pt unable; able to bridege with bil LEs however very effortful; sitting knee extension 3+/5, isometrically 5/5, functionally able to slowly descend into chair with light use of UEs; DF 3/5 in supine (no foot drop with ambulation) LLE Deficits / Details: bradykinesia; isometrically 5/5 hips (all planes) however when asked to unilateral bridge, pt unable; able to bridege with bil LEs however very effortful; sitting knee extension 3+/5, isometrically 5/5, functionally able to slowly descend into chair with light use of UEs; DF 2+/5 in supine (no foot drop with ambulation)  Cervical / Trunk Assessment: Normal  Communication   Communication: No difficulties  Cognition Arousal/Alertness: Awake/alert Behavior During Therapy: WFL for tasks assessed/performed Overall Cognitive Status: Within Functional Limits for tasks assessed                      General Comments General comments (skin integrity, edema, etc.): girlfriend present throughout; discussed possible recent stressors (noted pt with incr migraines x 10 days PTA) and pt denies anything new or overly stressful    Exercises Low Level/ICU Exercises Ankle Circles/Pumps: AROM;Both;5 reps Heel Slides: AROM;Both;Other reps (comment) Stabilized Bridging: AROM;Both;Other reps (comment) (3)      Assessment/Plan    PT Assessment Patient needs continued PT services  PT Diagnosis Difficulty walking;Generalized weakness   PT Problem List Decreased strength;Decreased activity tolerance;Decreased balance;Decreased mobility;Decreased knowledge of use of DME;Decreased safety awareness  PT Treatment Interventions DME instruction;Gait training;Stair  training;Functional mobility training;Therapeutic activities;Therapeutic exercise;Neuromuscular re-education;Cognitive remediation;Patient/family education   PT Goals (Current goals can be found in the Care Plan section) Acute Rehab PT Goals Patient Stated Goal: to get my strength back PT Goal Formulation: With patient Time For Goal Achievement: 04/05/14 Potential to Achieve Goals: Good    Frequency Min 4X/week   Barriers to discharge Inaccessible home environment states he can stay at his mother's home (fewer steps)    Co-evaluation               End of Session Equipment Utilized During Treatment: Gait belt Activity Tolerance: Patient limited by fatigue Patient left: in chair;with call bell/phone within reach;with chair alarm set;with family/visitor present Nurse Communication: Mobility status    Functional Assessment Tool Used: clinical observation Functional Limitation: Mobility: Walking and moving around Mobility: Walking and Moving Around Current Status [phone]): At least 20 percent but less than 40 percent impaired, limited or restricted Mobility: Walking and Moving Around Goal Status [phone]): At least 1 percent but less than 20 percent impaired, limited or restricted    Time: 1052-1132 PT Time Calculation (min): 40 min   Charges:   PT Evaluation $Initial PT Evaluation Tier I: 1 Procedure PT Treatments $Gait Training: 8-22 mins $Therapeutic Exercise: 8-22 mins   PT G Codes:   Functional Assessment Tool Used: clinical observation Functional Limitation: Mobility: Walking and moving around    Lasharn Bufkin 04/01/2014, 12:04 PM Pager [phone]

## 2014-04-01 NOTE — Progress Notes (Signed)
UR completed 
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
## 2014-04-01 NOTE — Progress Notes (Addendum)
Subjective: Patient still feels his legs are weak.  He states his sensation has returned.  HE has no other complaints.  He does admit to drinking 3-5 glases wine per day but not every day.   Objective: Current vital signs: BP 143/99  Pulse 91  Temp(Src) 97.7 F (36.5 C) (Oral)  Resp 20  Ht 6\' 2"  (1.88 m)  Wt 88.6 kg (195 lb 5.2 oz)  BMI 25.07 kg/m2  SpO2 98% Vital signs in last 24 hours: Temp:  [97.7 F (36.5 C)-98.8 F (37.1 C)] 97.7 F (36.5 C) (10/30 0941) Pulse Rate:  [68-91] 91 (10/30 0941) Resp:  [16-20] 20 (10/30 0941) BP: (122-143)/(81-100) 143/99 mmHg (10/30 0941) SpO2:  [96 %-99 %] 98 % (10/30 0941)  Intake/Output from previous day: 10/29 0701 - 10/30 0700 In: 720 [P.O.:720] Out: 1450 [Urine:1450] Intake/Output this shift: Total I/O In: 240 [P.O.:240] Out: -  Nutritional status: General  Neurologic Exam: General: NAD Mental Status: Alert, oriented, thought content appropriate.  Speech fluent without evidence of aphasia.  Able to follow 3 step commands without difficulty. Cranial Nerves: II: Discs flat bilaterally; Visual fields grossly normal, pupils equal, round, reactive to light and accommodation III,IV, VI: ptosis not present, extra-ocular motions intact bilaterally V,VII: smile symmetric, facial light touch sensation normal bilaterally VIII: hearing normal bilaterally IX,X: gag reflex present XI: bilateral shoulder shrug XII: midline tongue extension without atrophy or fasciculations  Motor: Right : Upper extremity   5/5    Left:     Upper extremity   5/5  Lower extremity   5/5     Lower extremity   5/5 Tone and bulk:normal tone throughout; no atrophy noted Sensory: Pinprick and light touch intact throughout, bilaterally Deep Tendon Reflexes:  Right: Upper Extremity   Left: Upper extremity   biceps (C-5 to C-6) 2/4   biceps (C-5 to C-6) 2/4 tricep (C7) 2/4    triceps (C7) 2/4 Brachioradialis (C6) 2/4  Brachioradialis (C6) 2/4  Lower Extremity  Lower Extremity  quadriceps (L-2 to L-4) 2/4   quadriceps (L-2 to L-4) 2/4 Achilles (S1) 2/4   Achilles (S1) 2/4  Plantars: Right: downgoing   Left: downgoing Cerebellar: normal finger-to-nose,  normal heel-to-shin test    Lab Results: Basic Metabolic Panel:  Recent Labs Lab 03/31/14 0230 03/31/14 1316 04/01/14 0528  NA 144  --  141  K 4.0  --  4.7  CL 103  --  104  CO2 22  --  28  GLUCOSE 104*  --  98  BUN 8  --  9  CREATININE 1.04  --  1.15  CALCIUM 9.3  --  8.6  MG  --  2.0  --   PHOS  --  3.7  --     Liver Function Tests:  Recent Labs Lab 03/31/14 0230  AST 21  ALT 33  ALKPHOS 59  BILITOT <0.2*  PROT 7.2  ALBUMIN 4.0    Recent Labs Lab 03/31/14 0230  LIPASE 24   No results found for this basename: AMMONIA,  in the last 168 hours  CBC:  Recent Labs Lab 03/31/14 0230 04/01/14 0528  WBC 11.7* 8.1  NEUTROABS 6.4  --   HGB 17.0 14.9  HCT 46.6 44.2  MCV 95.7 100.5*  PLT 266 225    Cardiac Enzymes:  Recent Labs Lab 03/31/14 1316  CKTOTAL 102    Lipid Panel: No results found for this basename: CHOL, TRIG, HDL, CHOLHDL, VLDL, LDLCALC,  in the last 168 hours  CBG:  Recent Labs Lab 03/31/14 0315  GLUCAP 117*    Microbiology: Results for orders placed during the hospital encounter of 06/20/09  CSF CELL COUNT WITH DIFFERENTIAL     Status: Abnormal   Collection Time    06/21/09  1:25 AM      Result Value Ref Range Status   Tube # 1   Final   Color, CSF COLORLESS  COLORLESS Final   Appearance, CSF CLEAR  CLEAR Final   Supernatant NOT INDICATED   Final   RBC Count, CSF 7 (*) 0 /cu mm Final   WBC, CSF 1  0 - 5 /cu mm Final   Segmented Neutrophils-CSF RARE TOO FEW TO COUNT, SMEAR AVAILABLE FOR REVIEW  0 - 6 % Final   Lymphs, CSF RARE  40 - 80 % Final  PROTEIN AND GLUCOSE, CSF     Status: None   Collection Time    06/21/09  1:25 AM      Result Value Ref Range Status   Glucose, CSF 72  43 - 76 mg/dL Final   Total  Protein, CSF 39   15 - 45 mg/dL Final  CSF CELL COUNT WITH DIFFERENTIAL     Status: Abnormal   Collection Time    06/21/09  1:26 AM      Result Value Ref Range Status   Tube # 4   Final   Color, CSF COLORLESS  COLORLESS Final   Appearance, CSF CLEAR  CLEAR Final   Supernatant NOT INDICATED   Final   RBC Count, CSF 1 (*) 0 /cu mm Final   WBC, CSF 6 (*) 0 - 5 /cu mm Final   Segmented Neutrophils-CSF RARE TOO FEW TO COUNT, SMEAR AVAILABLE FOR REVIEW  0 - 6 % Final   Lymphs, CSF FEW  40 - 80 % Final   Monocyte-Macrophage-Spinal Fluid RARE  15 - 45 % Final  CSF CULTURE     Status: None   Collection Time    06/21/09  1:30 AM      Result Value Ref Range Status   Specimen Description CSF   Final   Special Requests NONE   Final   Gram Stain     Final   Value: CYTOSPIN WBC PRESENT,BOTH PMN AND MONONUCLEAR     NO ORGANISMS SEEN   Culture NO GROWTH 3 DAYS   Final   Report Status 06/24/2009 FINAL   Final    Coagulation Studies: No results found for this basename: LABPROT, INR,  in the last 72 hours  Imaging: Dg Chest 2 View  03/31/2014   CLINICAL DATA:  Seizure.  EXAM: CHEST  2 VIEW  COMPARISON:  02/28/2013  FINDINGS: Normal heart size and mediastinal contours. Mildly low lung volumes. No acute infiltrate or edema. No effusion or pneumothorax. No acute osseous findings.  IMPRESSION: No acute findings.   Electronically Signed   By: Tiburcio PeaJonathan  Watts M.D.   On: 03/31/2014 03:24   Ct Head Wo Contrast  03/31/2014   CLINICAL DATA:  Seizure, fall.  EXAM: CT HEAD WITHOUT CONTRAST  CT CERVICAL SPINE WITHOUT CONTRAST  TECHNIQUE: Multidetector CT imaging of the head and cervical spine was performed following the standard protocol without intravenous contrast. Multiplanar CT image reconstructions of the cervical spine were also generated.  COMPARISON:  06/04/2012 head CT  FINDINGS: CT HEAD FINDINGS  Maintained gray-white differentiation. No CT evidence of an acute infarction. No intraparenchymal hemorrhage, mass, mass  effect, or abnormal extra-axial fluid collection. The  ventricles, cisterns, sulci are normal in size, shape, and position. No displaced calvarial fracture. The visualized paranasal sinuses and mastoid air cells are predominantly clear.  CT CERVICAL SPINE FINDINGS  Mild biapical scarring and bullae. Maintained craniocervical relationship. No dens fracture. Maintained vertebral body heights. Degenerative disc disease at C5-6 and mild kyphosis at this level disc osteophyte complex at this level results in moderate central canal narrowing. Otherwise, maintained alignment. No acute fracture or dislocation. Paravertebral soft tissues within normal limits.  IMPRESSION: No acute intracranial abnormality.  C5-6 degenerative disc disease. No acute osseous finding of the cervical spine.   Electronically Signed   By: Jearld Lesch M.D.   On: 03/31/2014 03:29   Ct Cervical Spine Wo Contrast  03/31/2014   CLINICAL DATA:  Seizure, fall.  EXAM: CT HEAD WITHOUT CONTRAST  CT CERVICAL SPINE WITHOUT CONTRAST  TECHNIQUE: Multidetector CT imaging of the head and cervical spine was performed following the standard protocol without intravenous contrast. Multiplanar CT image reconstructions of the cervical spine were also generated.  COMPARISON:  06/04/2012 head CT  FINDINGS: CT HEAD FINDINGS  Maintained gray-white differentiation. No CT evidence of an acute infarction. No intraparenchymal hemorrhage, mass, mass effect, or abnormal extra-axial fluid collection. The ventricles, cisterns, sulci are normal in size, shape, and position. No displaced calvarial fracture. The visualized paranasal sinuses and mastoid air cells are predominantly clear.  CT CERVICAL SPINE FINDINGS  Mild biapical scarring and bullae. Maintained craniocervical relationship. No dens fracture. Maintained vertebral body heights. Degenerative disc disease at C5-6 and mild kyphosis at this level disc osteophyte complex at this level results in moderate central canal  narrowing. Otherwise, maintained alignment. No acute fracture or dislocation. Paravertebral soft tissues within normal limits.  IMPRESSION: No acute intracranial abnormality.  C5-6 degenerative disc disease. No acute osseous finding of the cervical spine.   Electronically Signed   By: Jearld Lesch M.D.   On: 03/31/2014 03:29   Ct Lumbar Spine Wo Contrast  03/31/2014   CLINICAL DATA:  Initial evaluation for inability to move legs. Recent seizure.  EXAM: CT LUMBAR SPINE WITHOUT CONTRAST  TECHNIQUE: Multidetector CT imaging of the lumbar spine was performed without intravenous contrast administration. Multiplanar CT image reconstructions were also generated.  COMPARISON:  Prior study from 11/10/2013.  FINDINGS: The vertebral bodies are normally aligned with preservation of the normal lumbar lordosis. Vertebral body heights are well preserved. No acute fracture or listhesis.  Paraspinous soft tissues are within normal limits. The visualized visceral structures are unremarkable.  Mild degenerative disc bulge present at L5-S1 without significant stenosis. Otherwise, no significant degenerative changes seen within the lumbar spine.  IMPRESSION: 1. No acute fracture or listhesis within the lumbar spine. 2. Mild diffuse degenerative disc bulge at L5-S1 without significant stenosis. No other significant degenerative changes within the lumbar spine. No evidence for cord compression.   Electronically Signed   By: Rise Mu M.D.   On: 03/31/2014 06:42   Mr Brain Wo Contrast  03/31/2014   CLINICAL DATA:  29 year old male with syncope, loss of consciousness, fall, with subsequent confusion and lower extremity weakness. Seizure. Initial encounter.  EXAM: MRI HEAD WITHOUT CONTRAST  TECHNIQUE: Multiplanar, multiecho pulse sequences of the brain and surrounding structures were obtained without intravenous contrast.  COMPARISON:  Head CTs without contrast 0245 hr the same day and earlier.  FINDINGS: Cerebral  volume is normal. No restricted diffusion to suggest acute infarction. No midline shift, mass effect, evidence of mass lesion, ventriculomegaly, extra-axial collection or acute intracranial  hemorrhage. Cervicomedullary junction and pituitary are within normal limits. Negative visualized cervical spine. Major intracranial vascular flow voids are preserved. Wallace Cullens and white matter signal is within normal limits throughout the brain.  Visualized scalp soft tissues are within normal limits. Visible internal auditory structures appear normal. Minor paranasal sinus mucosal thickening. Visualized orbit soft tissues are within normal limits. Normal bone marrow signal.  IMPRESSION: Normal non contrast MRI appearance of the brain.   Electronically Signed   By: Augusto [name] M.D.   On: 03/31/2014 11:51   EEG: Clinical Interpretation: This normal EEG is recorded in the waking and drowsystate. There was no seizure or seizure predisposition recorded on this study.   Magnesium and phosphorus--normal  Medications:  Scheduled: . sodium chloride  3 mL Intravenous Q12H    Assessment/Plan: 28 YO male with episode of confusion followed by LE weakness.  The weakness objectively has resolved but pateint continues to feel he is weak in bilateral legs.  MRI brain, CT C-spine and lumbar spine normal, Magnesium and phosphorus normal. EEG showed no epileptiform activity. Exam shows normal strength and no localizing abnormality. At this time would recommend PT to evaluate.  No further neurology work up recommended.  Follow up with PCP.  Neurology S/O  Felicie Morn PA-C [facility] [phone]  04/01/2014, 10:11 AM

## 2014-04-01 NOTE — Progress Notes (Signed)
PT Cancellation Note  Patient Details Name: Justin [name] MRN: [id number] DOB: 1985-11-25   Cancelled Treatment:    Reason Eval/Treat Not Completed: Pain limiting ability to participate. RN reports she just gave pt pain medicine for severe headache/migraine. Recommended PT return in 30-60 minutes once medicine has taken effect.    Isabelly Kobler 04/01/2014, 9:46 AM Pager [phone]

## 2014-04-01 NOTE — Progress Notes (Signed)
Occupational Therapy Treatment Patient Details Name: Justin [name] [name] MRN: [id number] DOB: [birth year] Today's Date: 04/01/2014    History of present illness Justin [name] is a 28 y.o. Male admitted 03/31/14 after syncopal episode at home. Upon arousal, pt was confused and c/o weakness and numbness in Bil LEs. MRI brain, CT C-spine and lumbar spine normal, Magnesium and phosphorus normal. EEG showed no epileptiform activity.   OT comments  Pt seen today for ADL session and functional mobility. Pt continues to be limited by weakness and shaking following effortful movement. 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 Pt to continue to benefit from acute OT to address safety with ADLs upon d/c home.    Follow Up Recommendations  No OT follow up;Supervision/Assistance - 24 hour    Equipment Recommendations  Tub/shower seat    Recommendations for Other Services      Precautions / Restrictions Precautions Precautions: Fall Precaution Comments: syncope with fall Restrictions Weight Bearing Restrictions: No       Mobility Bed Mobility               General bed mobility comments: Pt sitting up in recliner and returned to recliner.  Transfers Overall transfer level: Needs assistance Equipment used: Rolling walker (2 wheeled) Transfers: Sit to/from Stand Sit to Stand: Min guard         General transfer comment: Min guard for transfer due to safety. VC for hand placement and safety with RW during transitions.         ADL Overall ADL's : Needs assistance/impaired     Grooming: Min guard;Standing;Wash/dry hands               Lower Body Dressing: Min guard;Sit to/from stand Lower Body Dressing Details (indicate cue type and reason): pt able to reach bil LEs to don slippers and requires min guard for safety with sit<>stand Toilet Transfer: Min guard;Ambulation;RW           Functional mobility during ADLs: Minimal assistance;Rolling  walker General ADL Comments: Pt stood to use hand held urinal with min guard. He was agreeable to ambulate in hallway. After walking ~40 ft pt became shaky and unsteady and had to sit down. After brief rest break, pt was able to ambulate back into room. 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 Pt also shaking following ambulation.                Cognition  Arousal/Alertness: Awake/Alert Behavior During Therapy: WFL for tasks assessed/performed Overall Cognitive Status: Within Functional Limits for tasks assessed                                    Pertinent Vitals/ Pain       Pain Assessment: No/denies pain         Frequency Min 2X/week     Progress Toward Goals  OT Goals(current goals can now be found in the care plan section)  Progress towards OT goals: Progressing toward goals     Plan Discharge plan needs to be updated       End of Session Equipment Utilized During Treatment: Gait belt;Rolling walker   Activity Tolerance Patient tolerated treatment well   Patient Left in chair;with call bell/phone within reach;with chair alarm set;with family/visitor present       Functional Assessment Tool Used: clinical judgement Functional Limitation: Self  care Self Care Current Status [phone]): At least 1 percent but less than 20 percent impaired, limited or restricted Self Care Goal Status (U0454): 0 percent impaired, limited or restricted   Time: 0981-1914 OT Time Calculation (min): 23 min  Charges: OT G-codes **NOT FOR INPATIENT CLASS** Functional Assessment Tool Used: clinical judgement Functional Limitation: Self care Self Care Current Status (N8295): At least 1 percent but less than 20 percent impaired, limited or restricted Self Care Goal Status (A2130): 0 percent impaired, limited or restricted OT General Charges $OT Visit: 1 Procedure OT Treatments $Self Care/Home Management : 8-22 mins $Therapeutic Activity: 8-22 mins  Nena Jordan [name] 04/01/2014, 5:15 PM  Carney Living, OTR/L Occupational Therapist [phone] (pager)

## 2014-04-02 DIAGNOSIS — K5090 Crohn's disease, unspecified, without complications: Secondary | ICD-10-CM

## 2014-04-02 LAB — ALDOLASE: Aldolase: 5.7 U/L (ref <=8.1)

## 2014-04-02 LAB — CBC
HEMATOCRIT: 46.2 % (ref 39.0–52.0)
Hemoglobin: 16.3 g/dL (ref 13.0–17.0)
MCH: 34.9 pg — AB (ref 26.0–34.0)
MCHC: 35.3 g/dL (ref 30.0–36.0)
MCV: 98.9 fL (ref 78.0–100.0)
Platelets: 231 10*3/uL (ref 150–400)
RBC: 4.67 MIL/uL (ref 4.22–5.81)
RDW: 12.6 % (ref 11.5–15.5)
WBC: 8.1 10*3/uL (ref 4.0–10.5)

## 2014-04-02 LAB — BASIC METABOLIC PANEL
Anion gap: 11 (ref 5–15)
BUN: 9 mg/dL (ref 6–23)
CALCIUM: 9.5 mg/dL (ref 8.4–10.5)
CHLORIDE: 100 meq/L (ref 96–112)
CO2: 29 mEq/L (ref 19–32)
CREATININE: 1.15 mg/dL (ref 0.50–1.35)
GFR calc non Af Amer: 85 mL/min — ABNORMAL LOW (ref >90)
Glucose, Bld: 101 mg/dL — ABNORMAL HIGH (ref 70–99)
Potassium: 4.1 mEq/L (ref 3.7–5.3)
Sodium: 140 mEq/L (ref 137–147)
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
## 2014-04-02 NOTE — Progress Notes (Signed)
Patient and family expressed concern that they want to know the plan of care and MD paged to notify. Md called back and said he was coming to see the patient.

## 2014-04-02 NOTE — Progress Notes (Signed)
Patient d/c home, d/c instruction given and patient verbalized understanding. 

## 2014-04-06 LAB — VITAMIN B1: VITAMIN B1 (THIAMINE): 11 nmol/L (ref 8–30)

## 2014-06-06 ENCOUNTER — Ambulatory Visit: Payer: BC Managed Care – PPO | Attending: Family Medicine

## 2014-08-09 ENCOUNTER — Emergency Department (HOSPITAL_COMMUNITY): Payer: Non-veteran care

## 2014-08-09 ENCOUNTER — Encounter (HOSPITAL_COMMUNITY): Payer: Self-pay | Admitting: Emergency Medicine

## 2014-08-09 ENCOUNTER — Emergency Department (HOSPITAL_COMMUNITY)
Admission: EM | Admit: 2014-08-09 | Discharge: 2014-08-09 | Disposition: A | Discharge status: Home / Self Care | Payer: Non-veteran care | Attending: Emergency Medicine | Admitting: Emergency Medicine

## 2014-08-09 DIAGNOSIS — Z8659 Personal history of other mental and behavioral disorders: Secondary | ICD-10-CM | POA: Diagnosis not present

## 2014-08-09 DIAGNOSIS — Z720 Tobacco use: Secondary | ICD-10-CM | POA: Diagnosis not present

## 2014-08-09 DIAGNOSIS — R079 Chest pain, unspecified: Secondary | ICD-10-CM

## 2014-08-09 DIAGNOSIS — R0789 Other chest pain: Secondary | ICD-10-CM | POA: Insufficient documentation

## 2014-08-09 DIAGNOSIS — K2100 Gastro-esophageal reflux disease with esophagitis, without bleeding: Secondary | ICD-10-CM

## 2014-08-09 DIAGNOSIS — Z79899 Other long term (current) drug therapy: Secondary | ICD-10-CM | POA: Diagnosis not present

## 2014-08-09 DIAGNOSIS — Z8781 Personal history of (healed) traumatic fracture: Secondary | ICD-10-CM | POA: Diagnosis not present

## 2014-08-09 HISTORY — DX: Unspecified convulsions: R56.9

## 2014-08-09 LAB — BASIC METABOLIC PANEL
Anion gap: 9 (ref 5–15)
BUN: 11 mg/dL (ref 6–23)
CO2: 26 mmol/L (ref 19–32)
Calcium: 9.6 mg/dL (ref 8.4–10.5)
Chloride: 106 mmol/L (ref 96–112)
Creatinine, Ser: 1.01 mg/dL (ref 0.50–1.35)
GFR calc Af Amer: >90 mL/min (ref >90)
GLUCOSE: 132 mg/dL — AB (ref 70–99)
Potassium: 3.4 mmol/L — ABNORMAL LOW (ref 3.5–5.1)
Sodium: 141 mmol/L (ref 135–145)

## 2014-08-09 LAB — CBC
HCT: 46.4 % (ref 39.0–52.0)
Hemoglobin: 15.9 g/dL (ref 13.0–17.0)
MCH: 34.3 pg — ABNORMAL HIGH (ref 26.0–34.0)
MCHC: 34.3 g/dL (ref 30.0–36.0)
MCV: 100 fL (ref 78.0–100.0)
Platelets: 312 10*3/uL (ref 150–400)
RBC: 4.64 MIL/uL (ref 4.22–5.81)
RDW: 12.7 % (ref 11.5–15.5)
WBC: 9.1 10*3/uL (ref 4.0–10.5)

## 2014-08-09 LAB — I-STAT TROPONIN, ED: TROPONIN I, POC: 0 ng/mL (ref 0.00–0.08)

## 2014-08-09 MED ORDER — SUCRALFATE 1 G PO TABS: 1.0000 g | ORAL_TABLET | Freq: Four times a day (QID) | ORAL | Status: DC

## 2014-08-09 NOTE — Progress Notes (Signed)
Pt back to room 9. Ordered IV saline lock @ 0125. Spoke with provider. We will wait until seen to evaluate need for IV access.  Juanda Bond Registered Nurse,  Emergency Department

## 2014-08-09 NOTE — Discharge Instructions (Signed)
Chest Pain (Nonspecific) It is often hard to give a diagnosis for the cause of chest pain. There is always a chance that your pain could be related to something serious, such as a heart attack or a blood clot in the lungs. You need to follow up with your doctor. HOME CARE  If antibiotic medicine was given, take it as directed by your doctor. Finish the medicine even if you start to feel better.  For the next few days, avoid activities that bring on chest pain. Continue physical activities as told by your doctor.  Do not use any tobacco products. This includes cigarettes, chewing tobacco, and e-cigarettes.  Avoid drinking alcohol.  Only take medicine as told by your doctor.  Follow your doctor's suggestions for more testing if your chest pain does not go away.  Keep all doctor visits you made. GET HELP IF:  Your chest pain does not go away, even after treatment.  You have a rash with blisters on your chest.  You have a fever. GET HELP RIGHT AWAY IF:   You have more pain or pain that spreads to your arm, neck, jaw, back, or belly (abdomen).  You have shortness of breath.  You cough more than usual or cough up blood.  You have very bad back or belly pain.  You feel sick to your stomach (nauseous) or throw up (vomit).  You have very bad weakness.  You pass out (faint).  You have chills. This is an emergency. Do not wait to see if the problems will go away. Call your local emergency services (911 in [place] Do not drive yourself to the hospital. MAKE SURE YOU:   Understand these instructions.  Will watch your condition.  Will get help right away if you are not doing well or get worse. Document Released: 11/06/2007 Document Revised: 05/25/2013 Document Reviewed: 11/06/2007 [facility] Patient Information 2015 [place], [place]. This information is not intended to replace advice given to you by your health care provider. Make sure you discuss any questions you have with your  health care provider.  Esophagitis Esophagitis is inflammation of the esophagus. It can involve swelling, soreness, and pain in the esophagus. This condition can make it difficult and painful to swallow. CAUSES  Most causes of esophagitis are not serious. Many different factors can cause esophagitis, including:  Gastroesophageal reflux disease (GERD). This is when acid from your stomach flows up into the esophagus.  Recurrent vomiting.  An allergic-type reaction.  Certain medicines, especially those that come in large pills.  Ingestion of harmful chemicals, such as household cleaning products.  Heavy alcohol use.  An infection of the esophagus.  Radiation treatment for cancer.  Certain diseases such as sarcoidosis, Crohn's disease, and scleroderma. These diseases may cause recurrent esophagitis. SYMPTOMS   Trouble swallowing.  Painful swallowing.  Chest pain.  Difficulty breathing.  Nausea.  Vomiting.  Abdominal pain. DIAGNOSIS  Your caregiver will take your history and do a physical exam. Depending upon what your caregiver finds, certain tests may also be done, including:  Barium X-ray. You will drink a solution that coats the esophagus, and X-rays will be taken.  Endoscopy. A lighted tube is put down the esophagus so your caregiver can examine the area.  Allergy tests. These can sometimes be arranged through follow-up visits. TREATMENT  Treatment will depend on the cause of your esophagitis. In some cases, steroids or other medicines may be given to help relieve your symptoms or to treat the underlying cause of your  condition. Medicines that may be recommended include:  Viscous lidocaine, to soothe the esophagus.  Antacids.  Acid reducers.  Proton pump inhibitors.  Antiviral medicines for certain viral infections of the esophagus.  Antifungal medicines for certain fungal infections of the esophagus.  Antibiotic medicines, depending on the cause of the  esophagitis. HOME CARE INSTRUCTIONS   Avoid foods and drinks that seem to make your symptoms worse.  Eat small, frequent meals instead of large meals.  Avoid eating for the 3 hours prior to your bedtime.  If you have trouble taking pills, use a pill splitter to decrease the size and likelihood of the pill getting stuck or injuring the esophagus on the way down. Drinking water after taking a pill also helps.  Stop smoking if you smoke.  Maintain a healthy weight.  Wear loose-fitting clothing. Do not wear anything tight around your waist that causes pressure on your stomach.  Raise the head of your bed 6 to 8 inches with wood blocks to help you sleep. Extra pillows will not help.  Only take over-the-counter or prescription medicines as directed by your caregiver. SEEK IMMEDIATE MEDICAL CARE IF:  You have severe chest pain that radiates into your arm, neck, or jaw.  You feel sweaty, dizzy, or lightheaded.  You have shortness of breath.  You vomit blood.  You have difficulty or pain with swallowing.  You have bloody or black, tarry stools.  You have a fever.  You have a burning sensation in the chest more than 3 times a week for more than 2 weeks.  You cannot swallow, drink, or eat.  You drool because you cannot swallow your saliva. MAKE SURE YOU:  Understand these instructions.  Will watch your condition.  Will get help right away if you are not doing well or get worse. Document Released: 06/27/2004 Document Revised: 08/12/2011 Document Reviewed: 01/18/2011 [facility] Patient Information 2015 [place], [place]. This information is not intended to replace advice given to you by your health care provider. Make sure you discuss any questions you have with your health care provider.  Food Choices for Gastroesophageal Reflux Disease When you have gastroesophageal reflux disease (GERD), the foods you eat and your eating habits are very important. Choosing the right foods can  help ease the discomfort of GERD. WHAT GENERAL GUIDELINES DO I NEED TO FOLLOW?  Choose fruits, vegetables, whole grains, low-fat dairy products, and low-fat meat, fish, and poultry.  Limit fats such as oils, salad dressings, butter, nuts, and avocado.  Keep a food diary to identify foods that cause symptoms.  Avoid foods that cause reflux. These may be different for different people.  Eat frequent small meals instead of three large meals each day.  Eat your meals slowly, in a relaxed setting.  Limit fried foods.  Cook foods using methods other than frying.  Avoid drinking alcohol.  Avoid drinking large amounts of liquids with your meals.  Avoid bending over or lying down until 2-3 hours after eating. WHAT FOODS ARE NOT RECOMMENDED? The following are some foods and drinks that may worsen your symptoms: Vegetables Tomatoes. Tomato juice. Tomato and spaghetti sauce. Chili peppers. Onion and garlic. Horseradish. Fruits Oranges, grapefruit, and lemon (fruit and juice). Meats High-fat meats, fish, and poultry. This includes hot dogs, ribs, ham, sausage, salami, and bacon. Dairy Whole milk and chocolate milk. Sour cream. Cream. Butter. Ice cream. Cream cheese.  Beverages Coffee and tea, with or without caffeine. Carbonated beverages or energy drinks. Condiments Hot sauce. Barbecue sauce.  Sweets/Desserts Chocolate and cocoa. Donuts. Peppermint and spearmint. Fats and Oils High-fat foods, including [place] fries and potato chips. Other Vinegar. Strong spices, such as black pepper, white pepper, red pepper, cayenne, curry powder, cloves, ginger, and chili powder. The items listed above may not be a complete list of foods and beverages to avoid. Contact your dietitian for more information. Document Released: 05/20/2005 Document Revised: 05/25/2013 Document Reviewed: 03/24/2013 [facility] Patient Information 2015 [place], [place]. This information is not intended to replace advice  given to you by your health care provider. Make sure you discuss any questions you have with your health care provider.

## 2014-08-09 NOTE — ED Notes (Signed)
Pt is c/o left chest pain that started about 1900   Pt states pain radiates up into his left shoulder and neck  Pt states the pain has been intermittent but for the past 45 minutes it has become constant and severe

## 2014-08-09 NOTE — ED Provider Notes (Signed)
CSN: [id number]     Arrival date & time 08/09/14  0109 History   First MD Initiated Contact with Patient 08/09/14 [phone]     Chief Complaint  Patient presents with  . Chest Pain     (Consider location/radiation/quality/duration/timing/severity/associated sxs/prior Treatment) HPI 29 year old male presents to emergency department with complaint of chest pain.  Pain started around 7:00 and was similar to his prior reflux.  He took 2 antacids and symptoms improved.  They then returned later Amer worse, with some radiation into his left axilla and back.  Pain was dull, strong, some burning in nature.  He denies any shortness of breath, diaphoresis or nausea.  Patient takes omeprazole for his reflux.  He reports he's had prior endoscopy showing grade 3 esophagitis.  Patient was concerned as the pain was so much worse than usual.  Father has history of coronary disease in his 41s.  Patient is a pack-a-day smoker. Past Medical History  Diagnosis Date  . Crohn's disease   . ADHD (attention deficit hyperactivity disorder)   . Foot fracture, right   . Seizures    Past Surgical History  Procedure Laterality Date  . Gallbladder surgery    . Arm surgery     Family History  Problem Relation Age of Onset  . Heart attack Father    History  Substance Use Topics  . Smoking status: Current Every Day Smoker -- 1.00 packs/day    Types: Cigarettes  . Smokeless tobacco: Not on file  . Alcohol Use: Yes    Review of Systems   See History of Present Illness; otherwise all other systems are reviewed and negative  Allergies  Iohexol; Ivp dye; and Nsaids  Home Medications   Prior to Admission medications   Medication Sig Start Date End Date Taking? Authorizing Provider  cholecalciferol (VITAMIN D) 1000 UNITS tablet Take 1,000 Units by mouth daily.   Yes Historical Provider, MD  divalproex (DEPAKOTE ER) 500 MG 24 hr tablet Take 1,000 mg by mouth daily.   Yes Historical Provider, MD  omeprazole  (PRILOSEC) 20 MG capsule Take 20 mg by mouth daily.   Yes Historical Provider, MD  propranolol (INDERAL) 40 MG tablet Take 40 mg by mouth at bedtime.   Yes Historical Provider, MD  sucralfate (CARAFATE) 1 G tablet Take 1 tablet (1 g total) by mouth 4 (four) times daily. 08/09/14   Marisa Severin, MD   BP 120/78 mmHg  Pulse 73  Temp(Src) 98.6 F (37 C) (Oral)  Resp 15  Ht  (1.88 m)  Wt 215 lb (97.523 kg)  BMI 27.59 kg/m2  SpO2 97% Physical Exam  Constitutional: He is oriented to person, place, and time. He appears well-developed and well-nourished.  HENT:  Head: Normocephalic and atraumatic.  Nose: Nose normal.  Mouth/Throat: Oropharynx is clear and moist.  Eyes: Conjunctivae and EOM are normal. Pupils are equal, round, and reactive to light.  Neck: Normal range of motion. Neck supple. No JVD present. No tracheal deviation present. No thyromegaly present.  Cardiovascular: Normal rate, regular rhythm, normal heart sounds and intact distal pulses.  Exam reveals no gallop and no friction rub.   No murmur heard. Pulmonary/Chest: Effort normal and breath sounds normal. No stridor. No respiratory distress. He has no wheezes. He has no rales. He exhibits no tenderness.  Abdominal: Soft. Bowel sounds are normal. He exhibits no distension and no mass. There is no tenderness. There is no rebound and no guarding.  Musculoskeletal: Normal range of motion. He  exhibits no edema or tenderness.  Lymphadenopathy:    He has no cervical adenopathy.  Neurological: He is alert and oriented to person, place, and time. He displays normal reflexes. He exhibits normal muscle tone. Coordination normal.  Skin: Skin is warm and dry. No rash noted. No erythema. No pallor.  Psychiatric: He has a normal mood and affect. His behavior is normal. Judgment and thought content normal.  Nursing note and vitals reviewed.   ED Course  Procedures (including critical care time) Labs Review Labs Reviewed  CBC - Abnormal;  Notable for the following:    MCH 34.3 (*)    All other components within normal limits  BASIC METABOLIC PANEL - Abnormal; Notable for the following:    Potassium 3.4 (*)    Glucose, Bld 132 (*)    All other components within normal limits  I-STAT TROPOININ, ED    Imaging Review Dg Chest 2 View  08/09/2014   CLINICAL DATA:  Increasing midsternal chest pain over the last few hours. Shortness of breath.  EXAM: CHEST  2 VIEW  COMPARISON:  03/31/2014  FINDINGS: Minimal lingular subsegmental atelectasis. The cardiomediastinal contours are normal. Pulmonary vasculature is normal. No consolidation, pleural effusion, or pneumothorax. No acute osseous abnormalities are seen.  IMPRESSION: No acute pulmonary process.   Electronically Signed   By: Rubye Oaks M.D.   On: 08/09/2014 04:12     EKG Interpretation   Date/Time:  Tuesday August 09 2014 01:15:57 EST Ventricular Rate:  96 PR Interval:  119 QRS Duration: 74 QT Interval:  353 QTC Calculation: 446 R Axis:   48 Text Interpretation:  Sinus rhythm Borderline short PR interval  Nonspecific T abnormalities, lateral leads Baseline wander in lead(s) III  V5 No significant change since last tracing Confirmed by Knox Cervi  MD, Kacee Koren  ([id number]) on 08/09/2014 4:30:43 AM      MDM   Final diagnoses:  Atypical chest pain  Gastroesophageal reflux disease with esophagitis    29 year old male with chest pain tonight.  Pain had resolved by my evaluation.  EKG with out ischemic changes.  Chest x-ray and lab work also unremarkable.  We'll start him on Carafate and follow up with his GI doctor at the [place].  Marisa Severin, MD 08/09/14 [phone]

## 2015-05-04 ENCOUNTER — Encounter (HOSPITAL_COMMUNITY): Payer: Self-pay

## 2015-05-04 ENCOUNTER — Emergency Department (HOSPITAL_COMMUNITY)
Admission: EM | Admit: 2015-05-04 | Discharge: 2015-05-04 | Disposition: A | Discharge status: Home / Self Care | Payer: Non-veteran care | Attending: Emergency Medicine | Admitting: Emergency Medicine

## 2015-05-04 DIAGNOSIS — Z8781 Personal history of (healed) traumatic fracture: Secondary | ICD-10-CM | POA: Diagnosis not present

## 2015-05-04 DIAGNOSIS — F17210 Nicotine dependence, cigarettes, uncomplicated: Secondary | ICD-10-CM | POA: Diagnosis not present

## 2015-05-04 DIAGNOSIS — Z8719 Personal history of other diseases of the digestive system: Secondary | ICD-10-CM | POA: Diagnosis not present

## 2015-05-04 DIAGNOSIS — F1410 Cocaine abuse, uncomplicated: Secondary | ICD-10-CM | POA: Insufficient documentation

## 2015-05-04 DIAGNOSIS — Z79899 Other long term (current) drug therapy: Secondary | ICD-10-CM | POA: Insufficient documentation

## 2015-05-04 DIAGNOSIS — F1010 Alcohol abuse, uncomplicated: Secondary | ICD-10-CM | POA: Insufficient documentation

## 2015-05-04 DIAGNOSIS — F10129 Alcohol abuse with intoxication, unspecified: Secondary | ICD-10-CM | POA: Diagnosis present

## 2015-05-04 DIAGNOSIS — F1910 Other psychoactive substance abuse, uncomplicated: Secondary | ICD-10-CM

## 2015-05-04 HISTORY — DX: Other psychoactive substance abuse, uncomplicated: F19.10

## 2015-05-04 MED ORDER — CHLORDIAZEPOXIDE HCL 25 MG PO CAPS: ORAL_CAPSULE | ORAL | Status: DC

## 2015-05-04 MED ORDER — LORAZEPAM 1 MG PO TABS
2.0000 mg | ORAL_TABLET | Freq: Once | ORAL | Status: AC
  Administered 2015-05-04: 2 mg via ORAL
  Filled 2015-05-04: qty 2

## 2015-05-04 NOTE — ED Notes (Addendum)
PT REQUESTING DETOX FROM ALCOHOL AND COCAINE. PT STATES HE HAS BEEN USING COCAINE X4 DAYS THIS WEEK, LAST TODAY. PT STATES HE DRINKS ALCOHOL DAILY BEER AND LIQUOR. PT FEELS "SHAKY" AT THE MOMENT. DENIES SI/HI AT THIS TIME.

## 2015-05-04 NOTE — ED Provider Notes (Signed)
CSN: [id number]     Arrival date & time 05/04/15  1205 History   First MD Initiated Contact with Patient 05/04/15 1302     Chief Complaint  Patient presents with  . Addiction Problem    REQUESTING DETOX FROM ALCOHOL AND COCAINE. LAST USE THIS AM.     (Consider location/radiation/quality/duration/timing/severity/associated sxs/prior Treatment) HPI Comments: Pt requesting detox from etoh and cocaine--last use was this am Denies hi/si No abd/chest pain or bloody stools No emesis Slight tremors w/o hallucinations Hasn't contacted any out patient agencys No tx used pta, and nothing makes his sx better  The history is provided by the patient.    Past Medical History  Diagnosis Date  . Crohn's disease (HCC)   . ADHD (attention deficit hyperactivity disorder)   . Foot fracture, right   . Seizures (HCC)   . Substance abuse    Past Surgical History  Procedure Laterality Date  . Gallbladder surgery    . Arm surgery     Family History  Problem Relation Age of Onset  . Heart attack Father    Social History  Substance Use Topics  . Smoking status: Current Every Day Smoker -- 1.00 packs/day    Types: Cigarettes  . Smokeless tobacco: None  . Alcohol Use: Yes     Comment: DAILY    Review of Systems  All other systems reviewed and are negative.     Allergies  Iohexol; Ivp dye; and Nsaids  Home Medications   Prior to Admission medications   Medication Sig Start Date End Date Taking? Authorizing Provider  Ascorbic Acid (VITAMIN C PO) Take by mouth daily.   Yes Historical Provider, MD  cholecalciferol (VITAMIN D) 1000 UNITS tablet Take 1,000 Units by mouth daily.   Yes Historical Provider, MD  Cyanocobalamin (B-12 PO) Take by mouth.   Yes Historical Provider, MD  omeprazole (PRILOSEC) 20 MG capsule Take 20 mg by mouth daily.   Yes Historical Provider, MD  SUMAtriptan Succinate (IMITREX PO) Take 50 mg by mouth as needed (migraine. Repeat in 2 hours if needed. Max 2/24hrs.).     Yes Historical Provider, MD  traZODone (DESYREL) 100 MG tablet Take 100 mg by mouth at bedtime.   Yes Historical Provider, MD  venlafaxine XR (EFFEXOR-XR) 150 MG 24 hr capsule Take 150 mg by mouth daily with breakfast.   Yes Historical Provider, MD  VERAPAMIL HCL ER, CO, PO Take 180 mg by mouth daily.    Yes Historical Provider, MD  propranolol (INDERAL) 40 MG tablet Take 40 mg by mouth at bedtime.    Historical Provider, MD   BP 125/94 mmHg  Pulse 95  Temp(Src) 98 F (36.7 C) (Oral)  Resp 16  Ht 6\' 2"  (1.88 m)  Wt 99.791 kg  BMI 28.23 kg/m2  SpO2 96% Physical Exam  Constitutional: He is oriented to person, place, and time. He appears well-developed and well-nourished.  Non-toxic appearance. No distress.  HENT:  Head: Normocephalic and atraumatic.  Eyes: Conjunctivae, EOM and lids are normal. Pupils are equal, round, and reactive to light.  Neck: Normal range of motion. Neck supple. No tracheal deviation present. No thyroid mass present.  Cardiovascular: Normal rate, regular rhythm and normal heart sounds.  Exam reveals no gallop.   No murmur heard. Pulmonary/Chest: Effort normal and breath sounds normal. No stridor. No respiratory distress. He has no decreased breath sounds. He has no wheezes. He has no rhonchi. He has no rales.  Abdominal: Soft. Normal appearance and bowel sounds  are normal. He exhibits no distension. There is no tenderness. There is no rebound and no CVA tenderness.  Musculoskeletal: Normal range of motion. He exhibits no edema or tenderness.  Neurological: He is alert and oriented to person, place, and time. He has normal strength. No cranial nerve deficit or sensory deficit. GCS eye subscore is 4. GCS verbal subscore is 5. GCS motor subscore is 6.  Skin: Skin is warm and dry. No abrasion and no rash noted.  Psychiatric: He has a normal mood and affect. His speech is normal and behavior is normal.  Nursing note and vitals reviewed.   ED Course  Procedures  (including critical care time) Labs Review Labs Reviewed - No data to display  Imaging Review No results found. I have personally reviewed and evaluated these images and lab results as part of my medical decision-making.   EKG Interpretation None      MDM   Final diagnoses:  None    Pt with CIWA score of less than 5 and no acute psych emergency, will given ativan po here and rx for librium taper w/ outpt resources    Lorre Nick, MD 05/04/15 1528

## 2015-05-04 NOTE — ED Notes (Signed)
Discharge instructions and rx x1 reviewed with patient. Patient verbalized understanding 

## 2015-05-04 NOTE — Discharge Instructions (Signed)
Substance Abuse Treatment Programs ° °Intensive Outpatient Programs °[facility]     °[address]      °[place], [place]                   °[phone]      ° °[facility] °[address] °[place], [place] °[phone] ° °[facility]     °(Inpatient and outpatient)     °[address]           °[phone]   ° °[facility] °[phone] (Suboxone and Methadone) ° °[address]      °[place], [place] [address]      °[phone]      ° °[address] °[place], [place] °852-3033 ° °[facility] (Outpatient/Inpatient, [facility])    °(insurance only) [phone]      °       °[facility] (Groups & Residential) °[place], [place] °[phone] ° °   °[facility]     °[address]     °[place], [place]      °[phone]      ° °[facility] (for caregivers and family) °[address] °[place], [place] °336-299-4655 ° ° ° ° ° °Residential Treatment Programs °[facility]      °[address], [place], [place] [address]  °[phone]      ° °[facility] °[address] [place], [place] [address] °[phone] ° °[facility]        °[phone]      ° °[facility] °[phone] ° °[facility]             °[address]                                         °[place], [place]                                                °[phone] or [phone]                              ° °[facility] [place] °[address] °[place], [address] °[phone] ° °[facility]    °[address]      °[place], [place]     °[phone]      ° °[facility] °[address] °[place], [place] °[phone] ° °[facility]   °[address]     °[place], [place] [address]     °[phone]      °Admissions: 8am-3pm M-F ° °[facility] °[address] °[place] °[phone] ° °[facility]: Residential Program (90 Days)   °[place],       °336-725-8389 or [phone]    ° °[facility] °[place],  °(Walk in Hours over the weekend or by referral) ° °[facility] °[address], [place],  [address] °[phone] ° °Crisis Mobile: [facility]:  1-877-626-1772 (for crisis response 24 hours a day) °[facility] Hotline:      1-800-256-2452 °Outpatient Psychiatry and Counseling ° °[facility]: Mobile Crisis   Management 24 hours:  1-877-626-1772 ° °[facility] sliding scale fee and walk in schedule: M-F 8am-12pm/1pm-3pm °[address]  °[place], [place] [address] °[phone] ° °[facility] °[address] °[place], [place] [address] °[phone] ° °[facility] ([facility])- new patient walk-in appointments available Monday - Friday 8am -3pm.          °[address] °[place], [place] [address] °336-676-6840 or crisis line- 336-676-6905 ° °[facility] Intensive Outpatient Therapy Program °[address] °[place], [place] [address] °336-832-9804 ° °[facility]                  °Crisis Services      °[phone]      °[address]     °[place], [place] [address]                ° °[facility]   °[facility] °[phone] °[address] °[place], [place] [address] ° ° °[facility]          °[address]  °[place], [place] [address]       °[phone] ° °[facility] °[address] °[place], [place] [address] °336-292-1510 ° °[facility]    °[address]    °[place], [place] [address]     °[phone]      ° °Parish McKinney, MD     °[address]     °[place] [address]     °336-282-1251     °  °[facility] °[address] °[place] [address] ° °[facility]     °[address]     °[place], [place]      °336-542-2076      ° °[facility] °Shamsher Ahluwalia, MD °[address] °[place], [place] [address] °[phone] ° °[facility]     °[address]     °[place], [place] [address]     °336-274-1237      ° °[facility] °[address] °[place], [place] [address] °336-854-4450 °Resources for Temporary Residential Assistance/Crisis Centers ° °DAY CENTERS °[facility] °M-F 8am-3pm   °[address] [place], [place] [address]   336-332-0824 °Services include: laundry, barbering, support groups, case management, phone  & computer access, showers, AA/NA mtgs, mental health/substance abuse nurse, job skills class, disability information, VA assistance, spiritual classes, etc.  ° °HOMELESS SHELTERS ° ° [facility]     °[facility]   °[address], [place]     °[phone]       °       °[facility] (women and children)       °[address] °, [place] [address] °336-275-0820 °[email or web address] for application and process °Application Required ° °[facility]   °[address]    °[place] [address]     °[phone]       °             °[facility] °[address] °,  [address] °336.273.5572 °336-235-0363(schedule application appt.) °Application Required ° °[facility] (women only)    °[address]     °[place],  [address]     °336-884-1039      °  Intake starts 6pm daily °Need valid ID, SSC, & Police report °[facility] [place] °[address] °[place], [place] °336-881-5420 °Application Required ° °[facility] (men only)     °[address]      °[place], [place]     °[phone]      ° °[facility] °(Pregnant women only) °[address] °[place], [place] °336-275-0206 ° °[facility]      °[address]      °[place], [place] [address]     °[phone]      °       °[facility] °[address] °[place], [place] °336-723-1848 °90 day commitment/SA/Application process ° °[facility](men only)     °[address]     °[place], [place]     °[phone]       °Check-in at 7pm     °       °[facility] °[address] °[place], [place] [address] °336-248-6684 °Men/Women/Women and Children must be there by 7 pm ° °[facility] °[place],  °336-722-8721                ° °

## 2015-06-07 ENCOUNTER — Emergency Department (HOSPITAL_COMMUNITY): Payer: Non-veteran care

## 2015-06-07 ENCOUNTER — Encounter (HOSPITAL_COMMUNITY): Payer: Self-pay | Admitting: Emergency Medicine

## 2015-06-07 ENCOUNTER — Emergency Department (HOSPITAL_COMMUNITY)
Admission: EM | Admit: 2015-06-07 | Discharge: 2015-06-08 | Disposition: A | Discharge status: Home / Self Care | Payer: Non-veteran care | Attending: Emergency Medicine | Admitting: Emergency Medicine

## 2015-06-07 DIAGNOSIS — F17210 Nicotine dependence, cigarettes, uncomplicated: Secondary | ICD-10-CM | POA: Insufficient documentation

## 2015-06-07 DIAGNOSIS — Z79899 Other long term (current) drug therapy: Secondary | ICD-10-CM | POA: Insufficient documentation

## 2015-06-07 DIAGNOSIS — Z8659 Personal history of other mental and behavioral disorders: Secondary | ICD-10-CM | POA: Diagnosis not present

## 2015-06-07 DIAGNOSIS — Z8781 Personal history of (healed) traumatic fracture: Secondary | ICD-10-CM | POA: Diagnosis not present

## 2015-06-07 DIAGNOSIS — R059 Cough, unspecified: Secondary | ICD-10-CM

## 2015-06-07 DIAGNOSIS — B349 Viral infection, unspecified: Secondary | ICD-10-CM | POA: Diagnosis not present

## 2015-06-07 DIAGNOSIS — R05 Cough: Secondary | ICD-10-CM

## 2015-06-07 DIAGNOSIS — Z8719 Personal history of other diseases of the digestive system: Secondary | ICD-10-CM | POA: Insufficient documentation

## 2015-06-07 DIAGNOSIS — J029 Acute pharyngitis, unspecified: Secondary | ICD-10-CM | POA: Diagnosis present

## 2015-06-07 LAB — MONONUCLEOSIS SCREEN: Mono Screen: NEGATIVE

## 2015-06-07 NOTE — Discharge Instructions (Signed)

## 2015-06-07 NOTE — ED Notes (Signed)
Pt sent from his dr because dr thinks he has mono.  Has had pain under arms for 3 wks with sore throat and gen body aches.

## 2015-06-07 NOTE — ED Provider Notes (Signed)
CSN: [id number]     Arrival date & time 06/07/15  1830 History   First MD Initiated Contact with Patient 06/07/15 2108     Chief Complaint  Patient presents with  . Generalized Body Aches  . Sore Throat  . Diarrhea     (Consider location/radiation/quality/duration/timing/severity/associated sxs/prior Treatment) HPI Comments: Patient here complaining of 2 days of nausea and vomiting with watery stools. Also cough and congestion 2 days as well. Subjective fever without severe headache and photophobia or neck pain. Denies any rashes. No urinary symptoms. Has had a sore throat as well as some nasal congestion. Called his physician and told to come here. Nothing makes symptoms better worse as been using over-the-counter medication without relief  Patient is a 30 y.o. male presenting with pharyngitis and diarrhea. The history is provided by the patient.  Sore Throat  Diarrhea   Past Medical History  Diagnosis Date  . Crohn's disease (HCC)   . ADHD (attention deficit hyperactivity disorder)   . Foot fracture, right   . Seizures (HCC)   . Substance abuse    Past Surgical History  Procedure Laterality Date  . Gallbladder surgery    . Arm surgery     Family History  Problem Relation Age of Onset  . Heart attack Father    Social History  Substance Use Topics  . Smoking status: Current Every Day Smoker -- 1.00 packs/day    Types: Cigarettes  . Smokeless tobacco: None  . Alcohol Use: Yes     Comment: DAILY    Review of Systems  Gastrointestinal: Positive for diarrhea.  All other systems reviewed and are negative.     Allergies  Iohexol; Ivp dye; and Nsaids  Home Medications   Prior to Admission medications   Medication Sig Start Date End Date Taking? Authorizing Provider  Ascorbic Acid (VITAMIN C PO) Take by mouth daily.    Historical Provider, MD  chlordiazePOXIDE (LIBRIUM) 25 MG capsule 50mg  PO TID x 1D, then 25-50mg  PO BID X 1D, then 25-50mg  PO QD X 1D 05/04/15    Lorre Nick, MD  cholecalciferol (VITAMIN D) 1000 UNITS tablet Take 1,000 Units by mouth daily.    Historical Provider, MD  Cyanocobalamin (B-12 PO) Take by mouth.    Historical Provider, MD  omeprazole (PRILOSEC) 20 MG capsule Take 20 mg by mouth daily.    Historical Provider, MD  propranolol (INDERAL) 40 MG tablet Take 40 mg by mouth at bedtime.    Historical Provider, MD  SUMAtriptan Succinate (IMITREX PO) Take 50 mg by mouth as needed (migraine. Repeat in 2 hours if needed. Max 2/24hrs.).     Historical Provider, MD  traZODone (DESYREL) 100 MG tablet Take 100 mg by mouth at bedtime.    Historical Provider, MD  venlafaxine XR (EFFEXOR-XR) 150 MG 24 hr capsule Take 150 mg by mouth daily with breakfast.    Historical Provider, MD  VERAPAMIL HCL ER, CO, PO Take 180 mg by mouth daily.     Historical Provider, MD   BP 144/98 mmHg  Pulse 86  Temp(Src) 97.8 F (36.6 C) (Oral)  Resp 18  SpO2 100% Physical Exam  Constitutional: He is oriented to person, place, and time. He appears well-developed and well-nourished.  Non-toxic appearance. No distress.  HENT:  Head: Normocephalic and atraumatic.  Eyes: Conjunctivae, EOM and lids are normal. Pupils are equal, round, and reactive to light.  Neck: Normal range of motion. Neck supple. No tracheal deviation present. No thyroid mass present.  Cardiovascular: Normal rate, regular rhythm and normal heart sounds.  Exam reveals no gallop.   No murmur heard. Pulmonary/Chest: Effort normal and breath sounds normal. No stridor. No respiratory distress. He has no decreased breath sounds. He has no wheezes. He has no rhonchi. He has no rales.  Abdominal: Soft. Normal appearance and bowel sounds are normal. He exhibits no distension. There is no tenderness. There is no rebound and no CVA tenderness.  Musculoskeletal: Normal range of motion. He exhibits no edema or tenderness.  Neurological: He is alert and oriented to person, place, and time. He has normal  strength. No cranial nerve deficit or sensory deficit. GCS eye subscore is 4. GCS verbal subscore is 5. GCS motor subscore is 6.  Skin: Skin is warm and dry. No abrasion and no rash noted.  Psychiatric: He has a normal mood and affect. His speech is normal and behavior is normal.  Nursing note and vitals reviewed.   ED Course  Procedures (including critical care time) Labs Review Labs Reviewed  MONONUCLEOSIS SCREEN    Imaging Review No results found. I have personally reviewed and evaluated these images and lab results as part of my medical decision-making.   EKG Interpretation None      MDM   Final diagnoses:  Cough    Patient's chest x-ray Monospot negative. Likely viral illness and patient stable for discharge    Lorre Nick, MD 06/07/15 2223

## 2015-07-19 ENCOUNTER — Encounter (HOSPITAL_COMMUNITY): Payer: Self-pay | Admitting: Family Medicine

## 2015-07-19 ENCOUNTER — Emergency Department (HOSPITAL_COMMUNITY)
Admission: EM | Admit: 2015-07-19 | Discharge: 2015-07-19 | Disposition: A | Discharge status: Other Acute Inpatient Hospital | Payer: Non-veteran care | Attending: Emergency Medicine | Admitting: Emergency Medicine

## 2015-07-19 ENCOUNTER — Inpatient Hospital Stay (HOSPITAL_COMMUNITY)
Admission: AD | Admit: 2015-07-19 | Discharge: 2015-07-25 | DRG: 882 | Disposition: A | Discharge status: Home / Self Care | Payer: Federal, State, Local not specified - Other | Source: Intra-hospital | Attending: Psychiatry | Admitting: Psychiatry

## 2015-07-19 ENCOUNTER — Encounter (HOSPITAL_COMMUNITY): Payer: Self-pay | Admitting: General Practice

## 2015-07-19 DIAGNOSIS — R45850 Homicidal ideations: Secondary | ICD-10-CM

## 2015-07-19 DIAGNOSIS — F1020 Alcohol dependence, uncomplicated: Secondary | ICD-10-CM | POA: Diagnosis not present

## 2015-07-19 DIAGNOSIS — Z79899 Other long term (current) drug therapy: Secondary | ICD-10-CM | POA: Diagnosis not present

## 2015-07-19 DIAGNOSIS — F17210 Nicotine dependence, cigarettes, uncomplicated: Secondary | ICD-10-CM | POA: Diagnosis present

## 2015-07-19 DIAGNOSIS — F10239 Alcohol dependence with withdrawal, unspecified: Secondary | ICD-10-CM | POA: Diagnosis present

## 2015-07-19 DIAGNOSIS — F919 Conduct disorder, unspecified: Secondary | ICD-10-CM | POA: Diagnosis not present

## 2015-07-19 DIAGNOSIS — Z8249 Family history of ischemic heart disease and other diseases of the circulatory system: Secondary | ICD-10-CM

## 2015-07-19 DIAGNOSIS — Z8719 Personal history of other diseases of the digestive system: Secondary | ICD-10-CM | POA: Diagnosis not present

## 2015-07-19 DIAGNOSIS — F329 Major depressive disorder, single episode, unspecified: Secondary | ICD-10-CM | POA: Diagnosis present

## 2015-07-19 DIAGNOSIS — Z818 Family history of other mental and behavioral disorders: Secondary | ICD-10-CM | POA: Diagnosis not present

## 2015-07-19 DIAGNOSIS — F411 Generalized anxiety disorder: Secondary | ICD-10-CM | POA: Diagnosis present

## 2015-07-19 DIAGNOSIS — F332 Major depressive disorder, recurrent severe without psychotic features: Secondary | ICD-10-CM | POA: Diagnosis not present

## 2015-07-19 DIAGNOSIS — G4700 Insomnia, unspecified: Secondary | ICD-10-CM | POA: Diagnosis present

## 2015-07-19 DIAGNOSIS — F4310 Post-traumatic stress disorder, unspecified: Secondary | ICD-10-CM | POA: Diagnosis present

## 2015-07-19 DIAGNOSIS — F909 Attention-deficit hyperactivity disorder, unspecified type: Secondary | ICD-10-CM | POA: Diagnosis present

## 2015-07-19 DIAGNOSIS — F419 Anxiety disorder, unspecified: Secondary | ICD-10-CM | POA: Insufficient documentation

## 2015-07-19 DIAGNOSIS — Z8781 Personal history of (healed) traumatic fracture: Secondary | ICD-10-CM | POA: Diagnosis not present

## 2015-07-19 DIAGNOSIS — Z87820 Personal history of traumatic brain injury: Secondary | ICD-10-CM | POA: Diagnosis not present

## 2015-07-19 HISTORY — DX: Post-traumatic stress disorder, unspecified: F43.10

## 2015-07-19 LAB — CBC
HCT: 45.5 % (ref 39.0–52.0)
Hemoglobin: 15.6 g/dL (ref 13.0–17.0)
MCH: 34.1 pg — ABNORMAL HIGH (ref 26.0–34.0)
MCHC: 34.3 g/dL (ref 30.0–36.0)
MCV: 99.6 fL (ref 78.0–100.0)
PLATELETS: 287 10*3/uL (ref 150–400)
RBC: 4.57 MIL/uL (ref 4.22–5.81)
RDW: 12.9 % (ref 11.5–15.5)
WBC: 9.9 10*3/uL (ref 4.0–10.5)

## 2015-07-19 LAB — RAPID URINE DRUG SCREEN, HOSP PERFORMED
Amphetamines: NOT DETECTED
BENZODIAZEPINES: NOT DETECTED
Barbiturates: NOT DETECTED
COCAINE: NOT DETECTED
Opiates: NOT DETECTED
Tetrahydrocannabinol: NOT DETECTED

## 2015-07-19 LAB — COMPREHENSIVE METABOLIC PANEL
ALT: 26 U/L (ref 17–63)
AST: 19 U/L (ref 15–41)
Albumin: 4.4 g/dL (ref 3.5–5.0)
Alkaline Phosphatase: 71 U/L (ref 38–126)
Anion gap: 11 (ref 5–15)
BILIRUBIN TOTAL: 0.8 mg/dL (ref 0.3–1.2)
BUN: 10 mg/dL (ref 6–20)
CALCIUM: 8.9 mg/dL (ref 8.9–10.3)
CO2: 25 mmol/L (ref 22–32)
CREATININE: 1.03 mg/dL (ref 0.61–1.24)
Chloride: 104 mmol/L (ref 101–111)
Glucose, Bld: 89 mg/dL (ref 65–99)
Potassium: 3.5 mmol/L (ref 3.5–5.1)
Sodium: 140 mmol/L (ref 135–145)
TOTAL PROTEIN: 7.1 g/dL (ref 6.5–8.1)

## 2015-07-19 LAB — SALICYLATE LEVEL: Salicylate Lvl: <4 mg/dL (ref 2.8–30.0)

## 2015-07-19 LAB — ACETAMINOPHEN LEVEL

## 2015-07-19 LAB — ETHANOL: ALCOHOL ETHYL (B): 164 mg/dL — AB (ref <5)

## 2015-07-19 MED ORDER — VERAPAMIL HCL ER 180 MG PO TBCR
180.0000 mg | EXTENDED_RELEASE_TABLET | Freq: Once | ORAL | Status: AC
  Administered 2015-07-19: 180 mg via ORAL
  Filled 2015-07-19 (×2): qty 1

## 2015-07-19 MED ORDER — CHLORDIAZEPOXIDE HCL 25 MG PO CAPS
25.0000 mg | ORAL_CAPSULE | ORAL | Status: AC
  Administered 2015-07-22 – 2015-07-23 (×2): 25 mg via ORAL
  Filled 2015-07-19 (×2): qty 1

## 2015-07-19 MED ORDER — NICOTINE 21 MG/24HR TD PT24
21.0000 mg | MEDICATED_PATCH | Freq: Every day | TRANSDERMAL | Status: DC
  Administered 2015-07-20 – 2015-07-25 (×6): 21 mg via TRANSDERMAL
  Filled 2015-07-19 (×11): qty 1

## 2015-07-19 MED ORDER — HYDROXYZINE HCL 25 MG PO TABS
25.0000 mg | ORAL_TABLET | Freq: Four times a day (QID) | ORAL | Status: AC | PRN
  Administered 2015-07-20: 25 mg via ORAL
  Filled 2015-07-19: qty 1

## 2015-07-19 MED ORDER — VENLAFAXINE HCL ER 150 MG PO CP24
150.0000 mg | ORAL_CAPSULE | Freq: Every day | ORAL | Status: DC
  Administered 2015-07-20: 150 mg via ORAL
  Filled 2015-07-19 (×4): qty 1

## 2015-07-19 MED ORDER — CHLORDIAZEPOXIDE HCL 25 MG PO CAPS
25.0000 mg | ORAL_CAPSULE | Freq: Three times a day (TID) | ORAL | Status: AC
  Administered 2015-07-21 – 2015-07-22 (×3): 25 mg via ORAL
  Filled 2015-07-19 (×3): qty 1

## 2015-07-19 MED ORDER — ALUM & MAG HYDROXIDE-SIMETH 200-200-20 MG/5ML PO SUSP: 30.0000 mL | ORAL | Status: DC | PRN

## 2015-07-19 MED ORDER — MAGNESIUM HYDROXIDE 400 MG/5ML PO SUSP: 30.0000 mL | Freq: Every day | ORAL | Status: DC | PRN

## 2015-07-19 MED ORDER — ALPRAZOLAM 1 MG PO TABS
1.0000 mg | ORAL_TABLET | Freq: Every evening | ORAL | Status: DC | PRN
  Administered 2015-07-19: 1 mg via ORAL
  Filled 2015-07-19: qty 1

## 2015-07-19 MED ORDER — ALPRAZOLAM 0.5 MG PO TABS
1.0000 mg | ORAL_TABLET | Freq: Once | ORAL | Status: AC
  Administered 2015-07-19: 1 mg via ORAL

## 2015-07-19 MED ORDER — ACETAMINOPHEN 325 MG PO TABS
650.0000 mg | ORAL_TABLET | Freq: Four times a day (QID) | ORAL | Status: DC | PRN
  Administered 2015-07-19 – 2015-07-25 (×4): 650 mg via ORAL
  Filled 2015-07-19 (×4): qty 2

## 2015-07-19 MED ORDER — ALPRAZOLAM 0.5 MG PO TABS
1.0000 mg | ORAL_TABLET | Freq: Every evening | ORAL | Status: DC | PRN
  Filled 2015-07-19: qty 2

## 2015-07-19 MED ORDER — CHLORDIAZEPOXIDE HCL 25 MG PO CAPS
25.0000 mg | ORAL_CAPSULE | Freq: Every day | ORAL | Status: AC
  Administered 2015-07-24: 25 mg via ORAL
  Filled 2015-07-19: qty 1

## 2015-07-19 MED ORDER — CHLORDIAZEPOXIDE HCL 25 MG PO CAPS
25.0000 mg | ORAL_CAPSULE | Freq: Four times a day (QID) | ORAL | Status: AC | PRN
  Administered 2015-07-20 – 2015-07-22 (×2): 25 mg via ORAL
  Filled 2015-07-19 (×2): qty 1

## 2015-07-19 MED ORDER — CHLORDIAZEPOXIDE HCL 25 MG PO CAPS
25.0000 mg | ORAL_CAPSULE | Freq: Four times a day (QID) | ORAL | Status: AC
  Administered 2015-07-19 – 2015-07-21 (×6): 25 mg via ORAL
  Filled 2015-07-19 (×6): qty 1

## 2015-07-19 MED ORDER — THIAMINE HCL 100 MG/ML IJ SOLN
100.0000 mg | Freq: Once | INTRAMUSCULAR | Status: AC
  Administered 2015-07-19: 100 mg via INTRAMUSCULAR
  Filled 2015-07-19: qty 2

## 2015-07-19 MED ORDER — VENLAFAXINE HCL ER 150 MG PO CP24
150.0000 mg | ORAL_CAPSULE | Freq: Every day | ORAL | Status: DC
  Administered 2015-07-19: 150 mg via ORAL
  Filled 2015-07-19 (×4): qty 1

## 2015-07-19 MED ORDER — VITAMIN B-1 100 MG PO TABS
100.0000 mg | ORAL_TABLET | Freq: Every day | ORAL | Status: DC
  Administered 2015-07-20 – 2015-07-25 (×6): 100 mg via ORAL
  Filled 2015-07-19 (×9): qty 1

## 2015-07-19 MED ORDER — LOPERAMIDE HCL 2 MG PO CAPS: 2.0000 mg | ORAL_CAPSULE | ORAL | Status: AC | PRN

## 2015-07-19 MED ORDER — ONDANSETRON 4 MG PO TBDP: 4.0000 mg | ORAL_TABLET | Freq: Four times a day (QID) | ORAL | Status: AC | PRN

## 2015-07-19 MED ORDER — ADULT MULTIVITAMIN W/MINERALS CH
1.0000 | ORAL_TABLET | Freq: Every day | ORAL | Status: DC
  Administered 2015-07-20 – 2015-07-25 (×6): 1 via ORAL
  Filled 2015-07-19 (×9): qty 1

## 2015-07-19 NOTE — ED Provider Notes (Signed)
CSN: [id number]     Arrival date & time 07/19/15  0049 History   First MD Initiated Contact with Patient 07/19/15 [phone]     Chief Complaint  Patient presents with  . Homicidal     (Consider location/radiation/quality/duration/timing/severity/associated sxs/prior Treatment) HPI Patient presents with concern of homicidal ideation. Patient states that there is a substantial amount of family discord currently, and over the past few days he has had increasing thoughts of killing family members. No thoughts of killing himself. No physical pain.  He denies medical problems.    Past Medical History  Diagnosis Date  . Crohn's disease (HCC)   . ADHD (attention deficit hyperactivity disorder)   . Foot fracture, right   . Seizures (HCC)   . Substance abuse   . PTSD (post-traumatic stress disorder)    Past Surgical History  Procedure Laterality Date  . Gallbladder surgery    . Arm surgery     Family History  Problem Relation Age of Onset  . Heart attack Father    Social History  Substance Use Topics  . Smoking status: Current Every Day Smoker -- 1.00 packs/day    Types: Cigarettes  . Smokeless tobacco: None  . Alcohol Use: Yes     Comment: DAILY. Last drink: "earlier today"    Review of Systems  Constitutional:       Per HPI, otherwise negative  HENT:       Per HPI, otherwise negative  Respiratory:       Per HPI, otherwise negative  Cardiovascular:       Per HPI, otherwise negative  Gastrointestinal: Negative for vomiting.  Endocrine:       Negative aside from HPI  Genitourinary:       Neg aside from HPI   Musculoskeletal:       Per HPI, otherwise negative  Skin: Negative.   Neurological: Negative for syncope.  Psychiatric/Behavioral: Positive for behavioral problems. The patient is nervous/anxious.       Allergies  Iohexol; Ivp dye; and Nsaids  Home Medications   Prior to Admission medications   Medication Sig Start Date End Date Taking? Authorizing  Provider  ALPRAZolam Prudy Feeler) 1 MG tablet Take 1 mg by mouth at bedtime as needed for anxiety.   Yes Historical Provider, MD  venlafaxine XR (EFFEXOR-XR) 150 MG 24 hr capsule Take 150 mg by mouth daily with breakfast.   Yes Historical Provider, MD  VERAPAMIL HCL ER, CO, PO Take 180 mg by mouth daily.    Yes Historical Provider, MD  chlordiazePOXIDE (LIBRIUM) 25 MG capsule 50mg  PO TID x 1D, then 25-50mg  PO BID X 1D, then 25-50mg  PO QD X 1D Patient not taking: Reported on 06/07/2015 05/04/15   Lorre Nick, MD   BP 110/60 mmHg  Pulse 88  Temp(Src) 98.2 F (36.8 C) (Oral)  Resp 13  SpO2 98% Physical Exam  Constitutional: He is oriented to person, place, and time. He appears well-developed. No distress.  Sleeping, but awakens easily, offers HPI easily, falls asleep again quickly.   HENT:  Head: Normocephalic and atraumatic.  Eyes: Conjunctivae and EOM are normal.  Pulmonary/Chest: Effort normal. No stridor. No respiratory distress.  Abdominal: He exhibits no distension.  Musculoskeletal: He exhibits no edema.  Neurological: He is alert and oriented to person, place, and time.  Skin: Skin is warm and dry.  Psychiatric: He has a normal mood and affect. He expresses homicidal ideation.  Nursing note and vitals reviewed.   ED Course  Procedures (including  critical care time) Labs Review Labs Reviewed  ETHANOL - Abnormal; Notable for the following:    Alcohol, Ethyl (B) 164 (*)    All other components within normal limits  ACETAMINOPHEN LEVEL - Abnormal; Notable for the following:    Acetaminophen (Tylenol), Serum <10 (*)    All other components within normal limits  CBC - Abnormal; Notable for the following:    MCH 34.1 (*)    All other components within normal limits  COMPREHENSIVE METABOLIC PANEL  SALICYLATE LEVEL  URINE RAPID DRUG SCREEN, HOSP PERFORMED    Labs noted for intoxication, otherwise unremarkable.   MDM  Patient presents with concern of new homicidal  ideation. Patient denies any physical complaints, is hemodynamically stable. Patient was initially intoxicated, but is now oriented appropriately, though sleepy. Patient is medically cleared for psychiatric evaluation.   Gerhard Munch, MD 07/19/15 [phone]

## 2015-07-19 NOTE — ED Notes (Signed)
Pt provided w/breakfast tray.

## 2015-07-19 NOTE — BH Assessment (Signed)
[facility] Assessment Progress Note  Per Thedore Mins, MD, this pt requires psychiatric hospitalization at this time.  Berneice Heinrich, RN, [facility] has assigned pt to [facility] Rm 406-1.  Pt has signed Voluntary Admission and Consent for Treatment, as well as Consent to Release Information to his girlfriend and to the [facility], his outpatient provider, and a notification call has been placed.  Signed forms have been faxed to [facility].  Pt's nurse, Marylu Lund, has been notified, and agrees to send original paperwork along with pt via Juel Burrow, and to call report to [phone].  Doylene Canning, MA Triage Specialist [phone]

## 2015-07-19 NOTE — Tx Team (Addendum)
Initial Interdisciplinary Treatment Plan   PATIENT STRESSORS: Legal issue Marital or family conflict Medication change or noncompliance Substance abuse   PATIENT STRENGTHS: Average or above average intelligence Capable of independent living Communication skills   PROBLEM LIST: Problem List/Patient Goals Date to be addressed Date deferred Reason deferred Estimated date of resolution  Anger/HI 07/19/2015           Substance Abuse  07/19/2015           "PTSD Inpatient" 07/19/2015           "Get an outpatient psychiatrist" 07/19/2015     Hx of Chrons, &SZ 07-20-15            DISCHARGE CRITERIA:  Improved stabilization in mood, thinking, and/or behavior Verbal commitment to aftercare and medication compliance  PRELIMINARY DISCHARGE PLAN: Attend 12-step recovery group Outpatient therapy  PATIENT/FAMIILY INVOLVEMENT: This treatment plan has been presented to and reviewed with the patient, Justin [name].  The patient and family have been given the opportunity to ask questions and make suggestions.  Marzetta Board E 07/19/2015, 4:19 PM

## 2015-07-19 NOTE — ED Notes (Signed)
Patient states he lives with his brother. While living at his brothers, there is a lot of family dynamics that is causing stress. Pt reports he has been medication for 4 days. Pt is homicidal toward "my whole family in general". No plans on how to harm but "I know how I could do it pretty easily".

## 2015-07-19 NOTE — ED Notes (Signed)
Delay in lab draw, pt in bathroom 

## 2015-07-19 NOTE — Progress Notes (Addendum)
Phoned EDP -Pt stated he has not had his effexor in 4 days. Pt c/o leg cramps and just not feeling. Per EDP no EKG needed at this time. Pt stated he is mad at some family members. . He stated he served time in [place] and other tours . Pt admits to alcohol and drug usage. Per psch pt has been cleared for discharge to 406-1 at [facility]. Phoned [facility] at 1pm to give report. Report given to Marzetta Board, RN. Pt ate 100% of his lunch. Phoned [facility] to transport the pt. (1:10pm)-1:30pm [facility] stated they would contact a driver and transfer the pt. Pt stated he fetl better sicne started back on his effexor.

## 2015-07-19 NOTE — Progress Notes (Signed)
Patient ID: Justin [name], male   DOB: 10-Jun-1985, 30 y.o.   MRN: [id number]  Justin [name] is a 30 year old male admitted to [facility] for substance abuse and HI towards his brother. He reports he feels like he is under a lot of pressure because he is taking care of "two different households." He reports he spends the week at his brother's house and the weekend with his girlfriend whom he reports is his support system. He states, "my family abandoned me then wanted to take up for my brother. 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 I was going to hit him in the trachea and crush it." He reports being in the [employer] for ten years and reports PTSD from that. He has a medical history of seizures last one he reports a year ago, and ADHD. See ED note for complete medical history. He currently denies SI/HI and A/V hallucinations. Patient denies any pain or discomfort. Skin search overall unremarkable. He was oriented to the unit and verbalized understanding of the admission process. Q15 minute safety checks initiated and maintained.

## 2015-07-19 NOTE — BH Assessment (Signed)
Assessment Note  Justin [name] is an 30 y.o. male with history of ADHD, PTSD, and Substance Abuse. He present to [facility] with c/o homicidal ideations toward his brother who is also his roommate. Onset of symptoms started 3-4 days ago after a argument with his brother. Patient sts, "My mother treats my brother better than she treats with me and she always takes my side". He does not have a history of violence. Patient has a plan to punch his brother in the trachea. Patient has a court date for a DUI 07/22/2015.   Patient denies SI. No self mutilating behaviors. He does report ongoing depressive symptoms such as fatigue, hopelessness, anger episodes, and isolating self from others. He reports a increase in anxiety. He has a family his of depression and Bipolar Disorder. No history of abuse.   No history of drug use. Patient does report a history of alcohol use. He drinks 4-5x's per week. He started drinking at the age of 47. He binge drinks per use. His last use was last night and he binged on alcohol Patient denies withdrawal symptoms. Reports history of seizures but he is unable to recall last seizure.   He has received inpatient treatment at [facility] in the past 01/09/2015. He receives outpatient medical and mental health services at the [facility]. Patient is interested in the PTSD clinic at the [facility] in [place] and would like assistance with transportation to the facility when discharged.   Diagnosis: PTSD, Depressive Disorder, PTSD  Past Medical History:  Past Medical History  Diagnosis Date  . Crohn's disease (HCC)   . ADHD (attention deficit hyperactivity disorder)   . Foot fracture, right   . Seizures (HCC)   . Substance abuse   . PTSD (post-traumatic stress disorder)     Past Surgical History  Procedure Laterality Date  . Gallbladder surgery    . Arm surgery      Family History:  Family History  Problem Relation Age of Onset  . Heart attack Father     Social History:  reports  that he has been smoking Cigarettes.  He has been smoking about 1.00 pack per day. He does not have any smokeless tobacco history on file. He reports that he drinks alcohol. He reports that he does not use illicit drugs.  Additional Social History:  Alcohol / Drug Use Pain Medications: SEE MAR Prescriptions: SEE MAR Over the Counter: SEE MAR History of alcohol / drug use?: Yes Substance #1 Name of Substance 1: Alcohol  1 - Age of First Use: 30 yrs old  1 - Amount (size/oz): binge drinking  1 - Frequency: 4-5x's per weeek  1 - Duration: on-going  1 - Last Use / Amount: "last night"  CIWA: CIWA-Ar BP: 110/60 mmHg Pulse Rate: 88 COWS:    Allergies:  Allergies  Allergen Reactions  . Iohexol Anaphylaxis    Pt had 1st sensitivity to Iohexol with periorbital swelling. His reaction during scan 2 days later was throat swelling, respiratory distress; admitted to ICU  . Ivp Dye [Iodinated Diagnostic Agents]   . Nsaids     Crohns    Home Medications:  (Not in a hospital admission)  OB/GYN Status:  No LMP for male patient.  General Assessment Data Location of Assessment: [facility] ED Is this a Tele or Face-to-Face Assessment?: Face-to-Face Is this an Initial Assessment or a Re-assessment for this encounter?: Initial Assessment Marital status: Single Maiden name:  (n/a) Is patient pregnant?: No Pregnancy Status: No Can  pt return to current living arrangement?: No Admission Status: Voluntary Is patient capable of signing voluntary admission?: No Referral Source: Self/Family/Friend Insurance type:  Financial risk analyst )  Medical Screening Exam [facility] Walk-in ONLY) Medical Exam completed: No  Crisis Care Plan Legal Guardian:  (n/a) Name of Psychiatrist:  (No psychiatrist ) Name of Therapist:  (No therapist )  Education Status Is patient currently in school?: No Current Grade:  (n/a) Highest grade of school patient has completed:  (n/a) Name of school:  (n/a) Contact person:   (n/a)  Risk to self with the past 6 months Suicidal Ideation: No Has patient been a risk to self within the past 6 months prior to admission? : No Suicidal Intent: No Has patient had any suicidal intent within the past 6 months prior to admission? : No Is patient at risk for suicide?: No Suicidal Plan?: No Has patient had any suicidal plan within the past 6 months prior to admission? : No Access to Means: No What has been your use of drugs/alcohol within the last 12 months?:  (none reported) Previous Attempts/Gestures: No How many times?:  (n/a) Other Self Harm Risks:  (n/a) Triggers for Past Attempts:  (no previous attempts or gestures) Intentional Self Injurious Behavior: None Family Suicide History: No Persecutory voices/beliefs?: No Depression: Yes Depression Symptoms: Feeling angry/irritable, Feeling worthless/self pity, Guilt, Fatigue, Loss of interest in usual pleasures, Isolating, Tearfulness, Insomnia, Despondent Substance abuse history and/or treatment for substance abuse?: No Suicide prevention information given to non-admitted patients: Not applicable  Risk to Others within the past 6 months Homicidal Ideation: Yes-Currently Present Does patient have any lifetime risk of violence toward others beyond the six months prior to admission? : Yes (comment) Thoughts of Harm to Others: Yes-Currently Present Comment - Thoughts of Harm to Others:  ("I will punch my brother in his trachea") Current Homicidal Intent: Yes-Currently Present Current Homicidal Plan: Yes-Currently Present Describe Current Homicidal Plan:  (punch brother in his trachea) Access to Homicidal Means: Yes Describe Access to Homicidal Means:  (ability to touch) Identified Victim:  (brother ) History of harm to others?: Yes Assessment of Violence: None Noted (currently calm and cooperative; no history of violence) Violent Behavior Description:  (patient currently calm and cooperative ) Does patient have  access to weapons?: No Criminal Charges Pending?: No Does patient have a court date: No Is patient on probation?: No  Psychosis Hallucinations: None noted Delusions: None noted  Mental Status Report Appearance/Hygiene: Disheveled Eye Contact: Good Motor Activity: Freedom of movement Speech: Logical/coherent Level of Consciousness: Alert Mood: Depressed Affect: Appropriate to circumstance Anxiety Level: None Thought Processes: Relevant, Coherent Judgement: Unimpaired Orientation: Person, Place, Time, Situation Obsessive Compulsive Thoughts/Behaviors: None  Cognitive Functioning Concentration: Decreased Memory: Recent Intact, Remote Intact IQ: Average Insight: Fair Impulse Control: Fair Appetite: Fair Weight Loss:  (none reported) Weight Gain:  (none reported) Sleep: Decreased Total Hours of Sleep:  (varies ) Vegetative Symptoms: None  ADLScreening [facility] Assessment Services) Patient's cognitive ability adequate to safely complete daily activities?: Yes Patient able to express need for assistance with ADLs?: Yes Independently performs ADLs?: Yes (appropriate for developmental age)  Prior Inpatient Therapy Prior Inpatient Therapy: Yes Prior Therapy Dates:  (01/09/2015) Prior Therapy Facilty/Provider(s):  [facility]) Reason for Treatment:  (homicidal thoughts, PTSD, medication management)  Prior Outpatient Therapy Prior Outpatient Therapy: No Prior Therapy Dates:  (n/a) Prior Therapy Facilty/Provider(s):  (n/a) Reason for Treatment:  (n/a) Does patient have an ACCT team?: No Does patient have Intensive In-House Services?  : No  Does patient have [facility] services? : No Does patient have P4CC services?: No  ADL Screening (condition at time of admission) Patient's cognitive ability adequate to safely complete daily activities?: Yes Is the patient deaf or have difficulty hearing?: No Does the patient have difficulty seeing, even when wearing glasses/contacts?: No Does the  patient have difficulty concentrating, remembering, or making decisions?: No Patient able to express need for assistance with ADLs?: Yes Does the patient have difficulty dressing or bathing?: No Independently performs ADLs?: Yes (appropriate for developmental age) Does the patient have difficulty walking or climbing stairs?: No Weakness of Legs: None Weakness of Arms/Hands: None  Home Assistive Devices/Equipment Home Assistive Devices/Equipment: None    Abuse/Neglect Assessment (Assessment to be complete while patient is alone) Physical Abuse: Denies Verbal Abuse: Denies Sexual Abuse: Denies Exploitation of patient/patient's resources: Denies Self-Neglect: Denies   Consults Spiritual Care Consult Needed: No Social Work Consult Needed: No Merchant navy officer (For Healthcare) Does patient have an advance directive?: No Would patient like information on creating an advanced directive?: No - patient declined information    Additional Information 1:1 In Past 12 Months?: No CIRT Risk: No Elopement Risk: No Does patient have medical clearance?: Yes     Disposition:  Disposition Initial Assessment Completed for this Encounter: Yes Disposition of Patient: Inpatient treatment program Type of inpatient treatment program: Adult (Dr. Ronni Rumble, NP recommends inpatient treatment )  On Site Evaluation by:   Reviewed with Physician:    Melynda Ripple [facility] 07/19/2015 11:00 AM

## 2015-07-19 NOTE — ED Notes (Signed)
Pt has in belonging bag: Grey and green phone, cig box, black sweater, black hoodie, black shirt, brown slippers.

## 2015-07-19 NOTE — Progress Notes (Signed)
D: Pt started on the Librium protocol. Pt presented with a CIWA of (8) for tremors, sweats, anxiety,agitation, and a headache. Pt denies any SI/HI/AVH. Pt is visible anda active within the milieu. Pt observed interacting with his peers in the dayroom.  A: Writer administered scheduled and prn medications to pt, per MD orders. Continued support and availability as needed was extended to this pt. Staff continues to monitor pt with q31min checks.  R: No adverse drug reactions noted. Pt receptive to treatment. Pt remains safe at this time.

## 2015-07-19 NOTE — ED Notes (Signed)
Bed: TR71 Expected date:  Expected time:  Means of arrival:  Comments: Triage 5

## 2015-07-19 NOTE — Progress Notes (Signed)
Adult Psychoeducational Group Note  Date:  07/19/2015 Time:  9:34 PM  Group Topic/Focus:  Wrap-Up Group:   The focus of this group is to help patients review their daily goal of treatment and discuss progress on daily workbooks.  Participation Level:  Active  Participation Quality:  Appropriate  Affect:  Appropriate  Cognitive:  Alert  Insight: Appropriate  Engagement in Group:  Engaged  Modes of Intervention:  Discussion  Additional Comments:  Pt stated that today was a rough day because he is starting to have withdrawals. One positive coping skill he has is to exercise.   Flonnie Hailstone 07/19/2015, 9:34 PM

## 2015-07-20 ENCOUNTER — Encounter (HOSPITAL_COMMUNITY): Payer: Self-pay | Admitting: Psychiatry

## 2015-07-20 DIAGNOSIS — F332 Major depressive disorder, recurrent severe without psychotic features: Secondary | ICD-10-CM

## 2015-07-20 DIAGNOSIS — F10239 Alcohol dependence with withdrawal, unspecified: Secondary | ICD-10-CM

## 2015-07-20 DIAGNOSIS — F4310 Post-traumatic stress disorder, unspecified: Principal | ICD-10-CM

## 2015-07-20 MED ORDER — DIVALPROEX SODIUM ER 250 MG PO TB24
250.0000 mg | ORAL_TABLET | Freq: Every day | ORAL | Status: DC
  Administered 2015-07-20 – 2015-07-21 (×2): 250 mg via ORAL
  Filled 2015-07-20 (×5): qty 1

## 2015-07-20 MED ORDER — VENLAFAXINE HCL ER 75 MG PO CP24
225.0000 mg | ORAL_CAPSULE | Freq: Every day | ORAL | Status: DC
  Administered 2015-07-21 – 2015-07-25 (×5): 225 mg via ORAL
  Filled 2015-07-20 (×7): qty 1

## 2015-07-20 MED ORDER — PANTOPRAZOLE SODIUM 20 MG PO TBEC
20.0000 mg | DELAYED_RELEASE_TABLET | Freq: Every day | ORAL | Status: DC
  Administered 2015-07-20 – 2015-07-25 (×6): 20 mg via ORAL
  Filled 2015-07-20 (×9): qty 1

## 2015-07-20 MED ORDER — PRAZOSIN HCL 1 MG PO CAPS
1.0000 mg | ORAL_CAPSULE | Freq: Every day | ORAL | Status: DC
  Administered 2015-07-20 – 2015-07-24 (×5): 1 mg via ORAL
  Filled 2015-07-20 (×5): qty 1
  Filled 2015-07-20: qty 7
  Filled 2015-07-20 (×2): qty 1

## 2015-07-20 NOTE — Progress Notes (Signed)
Patient ID: Justin [name], male   DOB: 1985-07-14, 30 y.o.   MRN: [id number] D: Client visible on unit, interacting with peers and on the phone. Client reports day was "alright" admission was due to "bout to hurt my brother real bad" reports harmful thoughts "gone down since I been taking my medicine" "I'm an alcoholic"  "they took stopped my Xanax, we going to see how that's going to work" A: Clinical research associate provided emotional support, encouraged client to take medications and speak to physicians in the morning about concerns. Staff will monitor q92min for safety. R:Client is safe on the unit, took medications without incidence.
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
## 2015-07-20 NOTE — Tx Team (Signed)
Interdisciplinary Treatment Plan Update (Adult) Date: 07/20/2015   Date: 07/20/2015 11:44 AM  Progress in Treatment:  Attending groups: Pt is new to milieu, continuing to assess  Participating in groups: Pt is new to milieu, continuing to assess  Taking medication as prescribed: Yes  Tolerating medication: Yes  Family/Significant othe contact made: No, CSW attempting to make contact with girlfriend Patient understands diagnosis: Yes AEB seeking help with PTSD Discussing patient identified problems/goals with staff: Yes  Medical problems stabilized or resolved: Yes  Denies suicidal/homicidal ideation: Yes Patient has not harmed self or Others: Yes   New problem(s) identified: None identified at this time.   Discharge Plan or Barriers: Pt requests transfer to [facility]. If unable to do this, he will return home to [facility] and follow-up with outpatient providers  Additional comments:  Patient and CSW reviewed pt's identified goals and treatment plan. Patient verbalized understanding and agreed to treatment plan. CSW reviewed [facility] "Discharge Process and Patient Involvement" Form. Pt verbalized understanding of information provided and signed form.   Reason for Continuation of Hospitalization:  Anxiety Depression Medication stabilization Withdrawal symptoms  Estimated length of stay: 3-5 days  Review of initial/current patient goals per problem list:   1.  Goal(s): Patient will participate in aftercare plan  Met:  Progressing  Target date: 3-5 days from date of admission   As evidenced by: Patient will participate within aftercare plan AEB aftercare provider and housing plan at discharge being identified.   07/20/15:  Pt requests transfer to [facility]. If unable to do this, he will return home to [facility] and follow-up with outpatient providers  2.  Goal (s): Patient will exhibit decreased depressive symptoms and suicidal ideations.  Met:  Progressing  Target date: 3-5 days from date  of admission   As evidenced by: Patient will utilize self rating of depression at 3 or below and demonstrate decreased signs of depression or be deemed stable for discharge by MD.  07/20/15: Pt rates depression at 5/10; denies SI  3.  Goal(s): Patient will demonstrate decreased signs and symptoms of anxiety.  Met:  No  Target date: 3-5 days from date of admission   As evidenced by: Patient will utilize self rating of anxiety at 3 or below and demonstrated decreased signs of anxiety, or be deemed stable for discharge by MD 07/20/15: Pt was admitted with increased levels of anxiety and is currently rating those symptoms highly. Pt will demonstrated decreased symptoms of anxiety and rate it at 3/10 prior to d/c. 4.  Goal(s): Patient will demonstrate decreased signs of withdrawal due to substance abuse  Met:  No  Target date: 3-5 days from date of admission   As evidenced by: Patient will produce a CIWA/COWS score of 0, have stable vitals signs, and no symptoms of withdrawal  07/20/15: Pt has CIWA score of 8, endorses tremor, sweats, anxiety, headache, and agitation  Attendees:  Patient:    Family:    Physician: Dr. Parke Poisson, MD  07/20/2015 11:44 AM  Nursing: Lars Pinks, RN Case manager  07/20/2015 11:44 AM  Clinical Social Worker Peri Maris, [place] 07/20/2015 11:44 AM  Other: Tilden Fossa, LCSWA 07/20/2015 11:44 AM  Clinical: Darrol Angel RN; Maureen Chatters, RN 07/20/2015 11:44 AM  Other: , RN Charge Nurse 07/20/2015 11:44 AM  Other: Hilda Lias, [place], [place] Social Work [phone]

## 2015-07-20 NOTE — [facility] Group Notes (Signed)
[facility] Mental [facility] Association Group Therapy 07/20/2015 1:15pm  Type of Therapy: Mental Health Association Presentation  Participation Level: Minimal  Participation Quality: Attentive  Affect: Flat, Lethargic  Cognitive: Oriented  Insight: Developing/Improving  Engagement in Therapy: Limited  Modes of Intervention: Discussion, Education and Socialization  Summary of Progress/Problems: Mental Health Association (MHA) Speaker came to talk about his personal journey with substance abuse and addiction. The pt processed ways by which to relate to the speaker. MHA speaker provided handouts and educational information pertaining to groups and services offered by the [facility]. Pt was engaged in speaker's presentation and was receptive to resources provided.    Chad Cordial, LCSWA 07/20/2015 1:46 PM

## 2015-07-20 NOTE — [facility] Suicide Risk Assessment (Signed)
[facility] INPATIENT:  Family/Significant Other Suicide Prevention Education  Suicide Prevention Education:  Education Completed; Althea Charon, Pt's ex-girlfriend [phone],  has been identified by the patient as the family member/significant other with whom the patient will be residing, and identified as the person(s) who will aid the patient in the event of a mental health crisis (suicidal ideations/suicide attempt).  With written consent from the patient, the family member/significant other has been provided the following suicide prevention education, prior to the and/or following the discharge of the patient.  The suicide prevention education provided includes the following:  Suicide risk factors  Suicide prevention and interventions  National Suicide Hotline telephone number  [facility] assessment telephone number  [facility] Emergency Assistance 911  [facility] and/or Residential Mobile Crisis Unit telephone number  Request made of family/significant other to:  Remove weapons (e.g., guns, rifles, knives), all items previously/currently identified as safety concern.    Remove drugs/medications (over-the-counter, prescriptions, illicit drugs), all items previously/currently identified as a safety concern.  The family member/significant other verbalizes understanding of the suicide prevention education information provided.  The family member/significant other agrees to remove the items of safety concern listed above.  Elaina Hoops 07/20/2015, 3:19 PM

## 2015-07-20 NOTE — Plan of Care (Signed)
Problem: Ineffective individual coping Goal: STG: Patient will remain free from self harm Outcome: Progressing Patient has denied any SI during this shift.

## 2015-07-20 NOTE — [facility] Group Notes (Signed)
[facility] Group Notes:  (Nursing/MHT/Case Management/Adjunct)  Date:  07/20/2015  Time:  0930  Type of Therapy:  Nurse Education  Participation Level:  Did Not Attend  Participation Quality:    Affect:    Cognitive:    Insight:    Engagement in Group:    Modes of Intervention:    Summary of Progress/Problems:  Justin [name] 07/20/2015, 0930

## 2015-07-20 NOTE — BHH Suicide Risk Assessment (Signed)
[facility] Admission Suicide Risk Assessment   Nursing information obtained from:  Patient Demographic factors:  Male, Caucasian, Access to firearms Current Mental Status:  NA Loss Factors:  Legal issues Historical Factors:  Family history of mental illness or substance abuse Risk Reduction Factors:  Sense of responsibility to family, Employed, Living with another person, especially a relative  Total Time spent with patient: 45 minutes Principal Problem:  PTSD, MDD, Alcohol Abuse  Diagnosis:   Patient Active Problem List   Diagnosis Date Noted  . Generalized anxiety disorder [F41.1] 07/19/2015  . Seizures (HCC) [R56.9] 03/31/2014  . Syncope and collapse [R55] 03/31/2014  . Lower extremity weakness [R29.898] 03/31/2014  . Abdominal pain [R10.9] 11/10/2013  . Drainage from wound [T14.8] 11/10/2013  . PTSD (post-traumatic stress disorder) [F43.10] 01/09/2013  . Alcohol dependence (HCC) [F10.20] 01/08/2013  . Polysubstance abuse [F19.10] 01/08/2013  . ATTENTION DEFICIT DISORDER, ADULT [F98.8] 05/22/2009  . Migraine [G43.909] 05/22/2009  . Crohn disease (HCC) [K50.90] 05/22/2009     Continued Clinical Symptoms:  Alcohol Use Disorder Identification Test Final Score (AUDIT): 19 The "Alcohol Use Disorders Identification Test", Guidelines for Use in Primary Care, Second Edition.  World Science writer [facility]). Score between 0-7:  no or low risk or alcohol related problems. Score between 8-15:  moderate risk of alcohol related problems. Score between 16-19:  high risk of alcohol related problems. Score 20 or above:  warrants further diagnostic evaluation for alcohol dependence and treatment.   CLINICAL FACTORS:  30 year old man, combat veteran, history of PTSD, history of mTBI, history of alcohol abuse, admitted due to worsening depression, chronic PTSD symptoms, and recent HI towards brother, with whom he lives, during a period of intoxication.     Psychiatric Specialty Exam: ROS   Blood pressure 135/90, pulse 86, temperature 97.8 F (36.6 C), temperature source Oral, resp. rate 16, height 6\' 2"  (1.88 m), weight 204 lb (92.534 kg), SpO2 98 %.Body mass index is 26.18 kg/(m^2).  See admit note MSE                                                       COGNITIVE FEATURES THAT CONTRIBUTE TO RISK:  Closed-mindedness and Loss of executive function    SUICIDE RISK:   Moderate:  Frequent suicidal ideation with limited intensity, and duration, some specificity in terms of plans, no associated intent, good self-control, limited dysphoria/symptomatology, some risk factors present, and identifiable protective factors, including available and accessible social support.  PLAN OF CARE: Patient will be admitted to inpatient psychiatric unit for stabilization and safety. Will provide and encourage milieu participation. Provide medication management and maked adjustments as needed.  Will also provide medication management to minimize risk of alcohol withdrawal. Will follow daily.    I certify that inpatient services furnished can reasonably be expected to improve the patient's condition.   Nehemiah Massed, MD 07/20/2015, 5:06 PM

## 2015-07-20 NOTE — Progress Notes (Signed)
Patient ID: Justin [name], male   DOB: 1985/10/20, 30 y.o.   MRN: [id number]  D: Patient in bed on approach today. Reports withdrawal symptoms such as tremors, diarrhea, cravings, agitation, and nausea. No extreme distress noted except what patient states. Reports depression and feelings of hopelessness "5" and anxiety "10". Asking about xanax this morning but explained that he is on the librium protocol and cannot be on two benzodiazepines at the same time. Patient interested in transferring to the [place]. Contracts for safety on the unit. Reports poor sleep with a fair appetite. A: Staff will continue to monitor on q 15 minute checks, follow treatment plan, and give medications as ordered. R: Cooperative on the unit
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
## 2015-07-21 MED ORDER — TRAZODONE HCL 50 MG PO TABS
50.0000 mg | ORAL_TABLET | Freq: Every evening | ORAL | Status: DC | PRN
  Administered 2015-07-21 – 2015-07-24 (×4): 50 mg via ORAL
  Filled 2015-07-21: qty 7
  Filled 2015-07-21 (×4): qty 1

## 2015-07-21 NOTE — BHH Group Notes (Signed)
[facility] LCSW Aftercare Discharge Planning Group Note  07/21/2015 8:45 AM  Pt did not attend, declined invitation.   Chad Cordial, LCSWA 07/21/2015 9:35 AM

## 2015-07-21 NOTE — Progress Notes (Signed)
[facility] MD Progress Note  07/21/2015 3:32 PM Justin [name]  MRN:  [id number] Subjective:   Patient reports active symptoms of alcohol WDL- these include feeling " sweaty", having tremors, and feeling " jittery". Mood remains depressed, dysphoric. Objective : I have discussed case with treatment team and have met with patient. Denies medication side effects.  As above, presents with symptoms of alcohol WDL- some distal tremors noted, but vitals stable at this time. Remains depressed, dysphoric, but denies any SI. Also, at this time denies any lingering HI towards brother, and denies any violent ideations towards anyone . On unit has been isolative , spending most time in his room. Principal Problem:  MDD, PTSD, Alcohol  Dependence, Alcohol WDL  Diagnosis:   Patient Active Problem List   Diagnosis Date Noted  . Generalized anxiety disorder [F41.1] 07/19/2015  . Seizures ([place]) [R56.9] 03/31/2014  . Syncope and collapse [R55] 03/31/2014  . Lower extremity weakness [R29.898] 03/31/2014  . Abdominal pain [R10.9] 11/10/2013  . Drainage from wound [T14.8] 11/10/2013  . PTSD (post-traumatic stress disorder) [F43.10] 01/09/2013  . Alcohol dependence ([place] [F10.20] 01/08/2013  . Polysubstance abuse [F19.10] 01/08/2013  . ATTENTION DEFICIT DISORDER, ADULT [F98.8] 05/22/2009  . Migraine [G43.909] 05/22/2009  . Crohn disease ([place]) [K50.90] 05/22/2009   Total Time spent with patient: 20 minutes    Past Medical History:  Past Medical History  Diagnosis Date  . Crohn's disease ([place])   . ADHD (attention deficit hyperactivity disorder)   . Foot fracture, right   . Seizures ([place])   . Substance abuse   . PTSD (post-traumatic stress disorder)     Past Surgical History  Procedure Laterality Date  . Gallbladder surgery    . Arm surgery     Family History:  Family History  Problem Relation Age of Onset  . Heart attack Father     Social History:  History  Alcohol Use  . 3.6 - 4.2 oz/week   . 6-7 Cans of beer per week    Comment: DAILY. Last drink: "earlier today"     History  Drug Use No    Social History   Social History  . Marital Status: Single    Spouse Name: N/A  . Number of Children: N/A  . Years of Education: N/A   Social History Main Topics  . Smoking status: Current Every Day Smoker -- 1.00 packs/day    Types: Cigarettes  . Smokeless tobacco: None  . Alcohol Use: 3.6 - 4.2 oz/week    6-7 Cans of beer per week     Comment: DAILY. Last drink: "earlier today"  . Drug Use: No  . Sexual Activity: Not Asked   Other Topics Concern  . None   Social History Narrative   Additional Social History:   Sleep: fair   Appetite:  Fair  Current Medications: Current Facility-Administered Medications  Medication Dose Route Frequency Provider Last Rate Last Dose  . acetaminophen (TYLENOL) tablet 650 mg  650 mg Oral Q6H PRN Delfin Gant, NP   650 mg at 07/19/15 2001  . alum & mag hydroxide-simeth (MAALOX/MYLANTA) 200-200-20 MG/5ML suspension 30 mL  30 mL Oral Q4H PRN Delfin Gant, NP      . chlordiazePOXIDE (LIBRIUM) capsule 25 mg  25 mg Oral Q6H PRN Laverle Hobby, PA-C   25 mg at 07/20/15 0209  . chlordiazePOXIDE (LIBRIUM) capsule 25 mg  25 mg Oral TID Laverle Hobby, PA-C   25 mg at 07/21/15 1203  Followed by  . [START ON 07/22/2015] chlordiazePOXIDE (LIBRIUM) capsule 25 mg  25 mg Oral BH-qamhs Spencer E Simon, PA-C       Followed by  . [START ON 07/24/2015] chlordiazePOXIDE (LIBRIUM) capsule 25 mg  25 mg Oral Daily Laverle Hobby, PA-C      . divalproex (DEPAKOTE ER) 24 hr tablet 250 mg  250 mg Oral QHS Myer Peer Bulmaro Feagans, MD   250 mg at 07/20/15 2131  . hydrOXYzine (ATARAX/VISTARIL) tablet 25 mg  25 mg Oral Q6H PRN Laverle Hobby, PA-C   25 mg at 07/20/15 0208  . loperamide (IMODIUM) capsule 2-4 mg  2-4 mg Oral PRN Laverle Hobby, PA-C      . magnesium hydroxide (MILK OF MAGNESIA) suspension 30 mL  30 mL Oral Daily PRN Delfin Gant, NP       . multivitamin with minerals tablet 1 tablet  1 tablet Oral Daily Laverle Hobby, PA-C   1 tablet at 07/21/15 0177  . nicotine (NICODERM CQ - dosed in mg/24 hours) patch 21 mg  21 mg Transdermal Daily Jenne Campus, MD   21 mg at 07/21/15 0831  . ondansetron (ZOFRAN-ODT) disintegrating tablet 4 mg  4 mg Oral Q6H PRN Laverle Hobby, PA-C      . pantoprazole (PROTONIX) EC tablet 20 mg  20 mg Oral Daily Jenne Campus, MD   20 mg at 07/21/15 0831  . prazosin (MINIPRESS) capsule 1 mg  1 mg Oral QHS Jenne Campus, MD   1 mg at 07/20/15 2131  . thiamine (VITAMIN B-1) tablet 100 mg  100 mg Oral Daily Laverle Hobby, PA-C   100 mg at 07/21/15 0831  . venlafaxine XR (EFFEXOR-XR) 24 hr capsule 225 mg  225 mg Oral Q breakfast Jenne Campus, MD   225 mg at 07/21/15 0831    Lab Results: No results found for this or any previous visit (from the past 65 hour(s)).  Blood Alcohol level:  Lab Results  Component Value Date   [facility] 164* 07/19/2015   ETH <11 08/15/2013    Physical Findings: AIMS: Facial and Oral Movements Muscles of Facial Expression: None, normal Lips and Perioral Area: None, normal Jaw: None, normal Tongue: None, normal,Extremity Movements Upper (arms, wrists, hands, fingers): None, normal Lower (legs, knees, ankles, toes): None, normal, Trunk Movements Neck, shoulders, hips: None, normal, Overall Severity Severity of abnormal movements (highest score from questions above): None, normal Incapacitation due to abnormal movements: None, normal Patient's awareness of abnormal movements (rate only patient's report): No Awareness, Dental Status Current problems with teeth and/or dentures?: No Does patient usually wear dentures?: No  CIWA:  CIWA-Ar Total: 3 COWS:     Musculoskeletal: Strength & Muscle Tone: within normal limits (+) distal tremors, (+) mild restlessness  Gait & Station: normal Patient leans: N/A  Psychiatric Specialty Exam: ROS describes feeling "  jittery" in the context of WDL , no visual disturbances, no seizures   Blood pressure 110/75, pulse 59, temperature 97.3 F (36.3 C), temperature source Oral, resp. rate 16, height 6' 2"  (1.88 m), weight 204 lb (92.534 kg), SpO2 98 %.Body mass index is 26.18 kg/(m^2).  General Appearance: Fairly Groomed  Engineer, water::  Good  Speech:  Normal Rate  Volume:  Normal  Mood:  Anxious and Depressed  Affect:  Constricted  Thought Process:  Linear  Orientation:  Full (Time, Place, and Person)  Thought Content:  denies hallucinations, no delusions, not internally preoccupied  Suicidal Thoughts:  No at this time denies any suicidal ideations, and denies any self injurious ideations   Homicidal Thoughts:  No- today denies any homicidal ideations towards brother or anyone else   Memory:  recent and remote grossly intact   Judgement:  Fair  Insight:  Fair  Psychomotor Activity:  mild restlessness, positive distal tremors  Concentration:  Good  Recall:  Good  Fund of Knowledge:Good  Language: Good  Akathisia:  Negative  Handed:  Right  AIMS (if indicated):     Assets:  Desire for Improvement Resilience  ADL's:  Intact  Cognition: WNL  Sleep:  Number of Hours: 6  Assessment - at this time patient is presenting with symptoms of withdrawal, mainly tremors, restlessness, and feeling " jittery", diaphoretic. Tolerating BZD detox protocol well thus far . Patient continues to present depressed, anxious, and has chronic PTSD symptoms . States he is tolerating medications well thus far.  Denies SI at this time. Has isolated in his room.  Treatment Plan Summary: Daily contact with patient to assess and evaluate symptoms and progress in treatment, Medication management, Plan inpatient treatment  and medications as  below  1. Encourage  Milieu and group participation to work on coping skills and symptom reduction 2. Continue Librium detox taper to address alcohol WDL symptoms  3. Continue Minipress 1 mgr  QHS for PTSD related nightmares  4. Continue Effexor XR  225 mgrs QDAY for depression and PTSD  5. Continue Depakote ER 250 mgrs QHS for history of angry outbursts/mood disorder- consider titrating gradually if well tolerated . Neita Garnet, MD 07/21/2015, 3:32 PM

## 2015-07-21 NOTE — BHH Group Notes (Signed)
BHH LCSW Group Therapy 07/21/2015 1:15pm  Type of Therapy: Group Therapy- Feelings Around Relapse and Recovery  Pt did not attend, declined invitation.   Chad Cordial, Theresia Majors [phone] 07/21/2015 2:37 PM

## 2015-07-21 NOTE — Progress Notes (Signed)
Recreation Therapy Notes   Date: 02.17.2017 Time: 9:30am Location: [address] Group Room   Group Topic: Stress Management  Goal Area(s) Addresses:  Patient will actively participate in stress management techniques presented during session.   Behavioral Response: Did not attend.   Marykay Lex Yoshimi Sarr, LRT/CTRS         Kaytie Ratcliffe L 07/21/2015 3:01 PM

## 2015-07-21 NOTE — BHH Group Notes (Signed)
Adult Psychoeducational Group Note  Date:  07/21/2015 Time:  9:44 PM  Group Topic/Focus:  Wrap-Up Group:   The focus of this group is to help patients review their daily goal of treatment and discuss progress on daily workbooks.  Participation Level:  Did Not Attend  Participation Quality:  None  Affect:  None  Cognitive:  None  Insight: None  Engagement in Group:  None  Modes of Intervention:  Discussion  Additional Comments:  Pt did not attend group.  Caroll Rancher A 07/21/2015, 9:44 PM

## 2015-07-21 NOTE — Progress Notes (Signed)
D-  Patient has complained of withdrawal symptoms including headache, tremors and GI upset.  Patient denies SI HI and AVH but states that his depression and anxiety are increased.  Patient has been room stuck for the majority of the shift due to withdrawals.   A- Assess patient for safety, monitor patient per CIWA protocol, offer medications as prescribed, engage patient in 1:1 therapeutic staff talks.   R- Patient able to contract for safety, offer medications as prescribed.

## 2015-07-22 DIAGNOSIS — F1020 Alcohol dependence, uncomplicated: Secondary | ICD-10-CM

## 2015-07-22 MED ORDER — DIVALPROEX SODIUM ER 500 MG PO TB24
500.0000 mg | ORAL_TABLET | Freq: Every day | ORAL | Status: DC
  Administered 2015-07-22 – 2015-07-24 (×3): 500 mg via ORAL
  Filled 2015-07-22 (×5): qty 1

## 2015-07-22 NOTE — Progress Notes (Addendum)
D: Patient had to be roused to come to medication window.  He appears irritable with flat, anxious affect.  He reports continuing withdrawal symptoms as stomach cramps, tremors and cravings.  He denies SI/HI/AVH.  Patient was asked to fill out his self inventory which he has refused.  After medications, patient went back to bed.  He has not attended any groups today.  Patient did state the librium is helping with his symptoms. Patient rates his depression and anxiety as a 5; hopelessness as a 3.  Patient's goal is to "get out of bed."   A: Continue to monitor medication management and MD orders.  Safety checks completed every 15 minutes per protocol.  Offer support and encouragement as needed. R: Patient has minimal interaction with staff and peers.  After note: Patient got off telephone with his mother and states, "my aunt died and I'm not doing so well."  Patient also states, "I haven't seen a doctor today."  He also states, "I haven't talked to anyone today and I need to."  Patient was irritable and anxious.  He was given a prn librium.  Talked to NP on [address] and she informed staff that she did indeed see the patient today.  Informed patient that he was seen.  Patient came up later and apologized to staff for his irritability.

## 2015-07-22 NOTE — Progress Notes (Signed)
[facility] MD Progress Note  07/22/2015 4:43 PM Justin [name]  MRN:  [id number] Subjective:   Patient reports " I still feel sick to my stomach"  Objective : Justin [name] is awake, alert and oriented X3 , found resting in bedroom.  Denies suicidal or homicidal ideation. Denies auditory or visual hallucination and does not appear to be responding to internal stimuli. Patient reports he is medication compliant without mediation side effects. States his depression 8/10. Patient states" I still feel sick to my stomach from this detox process." Reports poor appetite.reports  resting well though tout the day. Support, encouragement and reassurance was provided.   Principal Problem:  MDD, PTSD, Alcohol  Dependence, Alcohol WDL  Diagnosis:   Patient Active Problem List   Diagnosis Date Noted  . Generalized anxiety disorder [F41.1] 07/19/2015  . Seizures (HCC) [R56.9] 03/31/2014  . Syncope and collapse [R55] 03/31/2014  . Lower extremity weakness [R29.898] 03/31/2014  . Abdominal pain [R10.9] 11/10/2013  . Drainage from wound [T14.8] 11/10/2013  . PTSD (post-traumatic stress disorder) [F43.10] 01/09/2013  . Alcohol dependence (HCC) [F10.20] 01/08/2013  . Polysubstance abuse [F19.10] 01/08/2013  . ATTENTION DEFICIT DISORDER, ADULT [F98.8] 05/22/2009  . Migraine [G43.909] 05/22/2009  . Crohn disease (HCC) [K50.90] 05/22/2009   Total Time spent with patient: 20 minutes    Past Medical History:  Past Medical History  Diagnosis Date  . Crohn's disease (HCC)   . ADHD (attention deficit hyperactivity disorder)   . Foot fracture, right   . Seizures (HCC)   . Substance abuse   . PTSD (post-traumatic stress disorder)     Past Surgical History  Procedure Laterality Date  . Gallbladder surgery    . Arm surgery     Family History:  Family History  Problem Relation Age of Onset  . Heart attack Father     Social History:  History  Alcohol Use  . 3.6 - 4.2 oz/week  . 6-7 Cans of beer  per week    Comment: DAILY. Last drink: "earlier today"     History  Drug Use No    Social History   Social History  . Marital Status: Single    Spouse Name: N/A  . Number of Children: N/A  . Years of Education: N/A   Social History Main Topics  . Smoking status: Current Every Day Smoker -- 1.00 packs/day    Types: Cigarettes  . Smokeless tobacco: None  . Alcohol Use: 3.6 - 4.2 oz/week    6-7 Cans of beer per week     Comment: DAILY. Last drink: "earlier today"  . Drug Use: No  . Sexual Activity: Not Asked   Other Topics Concern  . None   Social History Narrative   Additional Social History:   Sleep: fair   Appetite:  Fair  Current Medications: Current Facility-Administered Medications  Medication Dose Route Frequency Provider Last Rate Last Dose  . acetaminophen (TYLENOL) tablet 650 mg  650 mg Oral Q6H PRN Earney Navy, NP   650 mg at 07/19/15 2001  . alum & mag hydroxide-simeth (MAALOX/MYLANTA) 200-200-20 MG/5ML suspension 30 mL  30 mL Oral Q4H PRN Earney Navy, NP      . chlordiazePOXIDE (LIBRIUM) capsule 25 mg  25 mg Oral Q6H PRN Kerry Hough, PA-C   25 mg at 07/22/15 1613  . chlordiazePOXIDE (LIBRIUM) capsule 25 mg  25 mg Oral BH-qamhs Kerry Hough, PA-C       Followed by  . [START ON  07/24/2015] chlordiazePOXIDE (LIBRIUM) capsule 25 mg  25 mg Oral Daily Kerry Hough, PA-C      . divalproex (DEPAKOTE ER) 24 hr tablet 250 mg  250 mg Oral QHS Rockey Situ Cobos, MD   250 mg at 07/21/15 2133  . hydrOXYzine (ATARAX/VISTARIL) tablet 25 mg  25 mg Oral Q6H PRN Kerry Hough, PA-C   25 mg at 07/20/15 0208  . loperamide (IMODIUM) capsule 2-4 mg  2-4 mg Oral PRN Kerry Hough, PA-C      . magnesium hydroxide (MILK OF MAGNESIA) suspension 30 mL  30 mL Oral Daily PRN Earney Navy, NP      . multivitamin with minerals tablet 1 tablet  1 tablet Oral Daily Kerry Hough, PA-C   1 tablet at 07/22/15 1610  . nicotine (NICODERM CQ - dosed in mg/24  hours) patch 21 mg  21 mg Transdermal Daily Craige Cotta, MD   21 mg at 07/22/15 0811  . ondansetron (ZOFRAN-ODT) disintegrating tablet 4 mg  4 mg Oral Q6H PRN Kerry Hough, PA-C      . pantoprazole (PROTONIX) EC tablet 20 mg  20 mg Oral Daily Craige Cotta, MD   20 mg at 07/22/15 9604  . prazosin (MINIPRESS) capsule 1 mg  1 mg Oral QHS Craige Cotta, MD   1 mg at 07/21/15 2133  . thiamine (VITAMIN B-1) tablet 100 mg  100 mg Oral Daily Kerry Hough, PA-C   100 mg at 07/22/15 5409  . traZODone (DESYREL) tablet 50 mg  50 mg Oral QHS PRN Worthy Flank, NP   50 mg at 07/21/15 2208  . venlafaxine XR (EFFEXOR-XR) 24 hr capsule 225 mg  225 mg Oral Q breakfast Craige Cotta, MD   225 mg at 07/22/15 8119    Lab Results: No results found for this or any previous visit (from the past 48 hour(s)).  Blood Alcohol level:  Lab Results  Component Value Date   [facility] 164* 07/19/2015   ETH <11 08/15/2013    Physical Findings: AIMS: Facial and Oral Movements Muscles of Facial Expression: None, normal Lips and Perioral Area: None, normal Jaw: None, normal Tongue: None, normal,Extremity Movements Upper (arms, wrists, hands, fingers): None, normal Lower (legs, knees, ankles, toes): None, normal, Trunk Movements Neck, shoulders, hips: None, normal, Overall Severity Severity of abnormal movements (highest score from questions above): None, normal Incapacitation due to abnormal movements: None, normal Patient's awareness of abnormal movements (rate only patient's report): No Awareness, Dental Status Current problems with teeth and/or dentures?: No Does patient usually wear dentures?: No  CIWA:  CIWA-Ar Total: 4 COWS:     Musculoskeletal: Strength & Muscle Tone: within normal limits  Gait & Station: normal Patient leans: N/A  Psychiatric Specialty Exam: Review of Systems  Psychiatric/Behavioral: Positive for depression and substance abuse. Negative for suicidal ideas and  hallucinations. The patient is nervous/anxious and has insomnia.   All other systems reviewed and are negative.   Blood pressure 116/76, pulse 92, temperature 97.4 F (36.3 C), temperature source Oral, resp. rate 18, height 6\' 2"  (1.88 m), weight 92.534 kg (204 lb), SpO2 98 %.Body mass index is 26.18 kg/(m^2).  General Appearance: Fairly Groomed  Patent attorney::  Good  Speech:  Normal Rate  Volume:  Normal  Mood:  Anxious and Depressed  Affect:  Constricted  Thought Process:  Linear  Orientation:  Full (Time, Place, and Person)  Thought Content:  denies hallucinations, no delusions, not internally  preoccupied   Suicidal Thoughts:  No at this time denies any suicidal ideations, and denies any self injurious ideations   Homicidal Thoughts:  No-  Memory:  recent and remote grossly intact   Judgement:  Fair  Insight:  Fair  Psychomotor Activity:  Restlessness and mild restlessness, positive distal tremors  Concentration:  Good  Recall:  Good  Fund of Knowledge:Good  Language: Good  Akathisia:  Negative  Handed:  Right  AIMS (if indicated):     Assets:  Desire for Improvement Resilience  ADL's:  Intact  Cognition: WNL  Sleep:  Number of Hours: 6.75    I agree with current treatment plan on 07/22/2015, Patient seen face-to-face for psychiatric evaluation follow-up, chart reviewed. Reviewed the information documented and agree with the treatment plan..  Treatment Plan Summary: Daily contact with patient to assess and evaluate symptoms and progress in treatment, Medication management, Plan inpatient treatment  and medications as  below  1. Encourage  Milieu and group participation to work on coping skills and symptom reduction 2. Continue Librium detox taper to address alcohol WDL symptoms  3. Continue Minipress 1 mgr QHS for PTSD related nightmares  4. Continue Effexor XR  225 mgrs QDAY for depression and PTSD  5. Increased Depakote ER 250 mgrs to 500mg  QHS for history of angry  outbursts/mood disorder- consider titrating gradually if well tolerated .  Oneta Rack, NP 07/22/2015, 4:43 PM I agreed with findings and treatment plan of this patient

## 2015-07-22 NOTE — Progress Notes (Signed)
Patient ID: Justin [name], male   DOB: 06/30/1985, 30 y.o.   MRN: [id number] D: Client visible on the unit, in dayroom interacting with peers. Client reports he received news that "my aunt died today, was feeling pretty good until then" Client reports decreased tremors. A: Writer provided emotional support. Medication reviewed, administered as ordered. Staff will monitor q54min for safety. R: Client is safe on the unit, took medications without incidence.

## 2015-07-22 NOTE — Plan of Care (Signed)
Problem: Alteration in mood & ability to function due to Goal: LTG-Patient demonstrates decreased signs of withdrawal (Patient demonstrates decreased signs of withdrawal to the point the patient is safe to return home and continue treatment in an outpatient setting)  Outcome: Not Progressing Patient continues to experience withdrawal symptoms.  He reports tremors, stomach cramping and cravings.  He remains in bed today.

## 2015-07-22 NOTE — Progress Notes (Signed)
Patient ID: Justin [name], male   DOB: Dec 07, 1985, 30 y.o.   MRN: [id number]   Type of Therapy:  Psychoeducational Skills  Participation Level:  Did Not Attend  Participation Quality:  Did Not Attend  Affect:  Did Not Attend  Cognitive:  Did Not Attend  Insight:  None  Engagement in Group:  Did Not Attend  Modes of Intervention:  Did Not Attend  Summary of Progress/Problems: Pt did not attend patient self inventory group.

## 2015-07-22 NOTE — [facility] Group Notes (Signed)
[facility] LCSW Group Therapy  07/22/2015  10 - 11 AM  Type of Therapy:  Group Therapy  Participation Level:  None; Did not attend, despite overhead announcement of group, followed by personal knock on pt's door with encouragement to attend by LCSW  Summary of Progress/Problems: The main focus of today's process group was for the patient to identify ways in which they have in the past sabotaged their own recovery. Motivational Interviewing was utilized to identify motivation they may have for wanting to change. The Stages of Change were explained using a handout, and patients identified where they currently are with regard to stages of change.   Catherine C Harrill, LCSW    

## 2015-07-22 NOTE — Progress Notes (Signed)
  D: Pt was laying in bed during the assessment. Pt continued to lay down and didn't engage the Clinical research associate in conversation. Pt hastily answered questions. Pt has no questions or concerns.    A:  Support and encouragement was offered. 15 min checks continued for safety.  R: Pt remains safe.

## 2015-07-23 MED ORDER — HYDROXYZINE HCL 25 MG PO TABS
25.0000 mg | ORAL_TABLET | ORAL | Status: DC | PRN
  Administered 2015-07-23 – 2015-07-25 (×5): 25 mg via ORAL
  Filled 2015-07-23: qty 1
  Filled 2015-07-23: qty 10
  Filled 2015-07-23 (×2): qty 1

## 2015-07-23 MED ORDER — ONDANSETRON HCL 4 MG PO TABS
4.0000 mg | ORAL_TABLET | Freq: Three times a day (TID) | ORAL | Status: DC | PRN
  Administered 2015-07-23: 4 mg via ORAL
  Filled 2015-07-23: qty 1

## 2015-07-23 NOTE — Progress Notes (Signed)
Patient ID: Justin [name], male   DOB: 25-Jan-1986, 30 y.o.   MRN: [id number]  DAR: Pt. Denies SI/HI and A/V Hallucinations. He reports sleep is good, appetite is fair, energy level is low, and concentration is good. He rates depression 3/10, hopelessness 1/10, and anxiety 6/10. Patient does not report any pain or discomfort at this time. Support and encouragement provided to the patient. Scheduled medications administered to patient per physician's orders. Patient is receptive and cooperative. He is seen in the milieu interacting with peers and following plan of care. He reports he's in touch with [facility] And states he has a bed Wednesday. Q15 minute checks are maintained for safety.

## 2015-07-23 NOTE — [facility] Group Notes (Signed)
[facility] LCSW Group Therapy Note   07/23/2015  10:00 AM   Type of Therapy and Topic: Group Therapy: Feelings Around Returning Home & Establishing a Supportive Framework and Activity to Identify signs of Improvement or Decompensation   Participation Level: Active   Description of Group:  Patients first processed thoughts and feelings about up coming discharge. These included fears of upcoming changes, lack of change, new living environments, judgements and expectations from others and overall stigma of MH issues. We then discussed what is a supportive framework? What does it look like feel like and how do I discern it from and unhealthy non-supportive network? Learn how to cope when supports are not helpful and don't support you. Discuss what to do when your family/friends are not supportive.   Therapeutic Goals Addressed in Processing Group:  1. Patient will identify one healthy supportive network that they can use at discharge. 2. Patient will identify one factor of a supportive framework and how to tell it from an unhealthy network. 3. Patient able to identify one coping skill to use when they do not have positive supports from others. 4. Patient will demonstrate ability to communicate their needs through discussion and/or role plays.  Summary of Patient Progress:  Pt engaged easily during group session and shared his plans to go to long term  facility in [place] where he will receive PTSD support . As patients processed their anxiety about discharge and described healthy supports patient  Shared no concerns Patient chose a visual to represent decompensation as jail and improvement as nature.  Carney Bern, LCSW

## 2015-07-23 NOTE — Progress Notes (Signed)
Group Note  Pt attended wrap-up group; participated actively. Appropriate behavior. 

## 2015-07-23 NOTE — [facility] Group Notes (Signed)
[facility] Group Notes:  (Nursing/MHT/Case Management/Adjunct)  Date:  07/23/2015  Time:  10:37 AM  Type of Therapy:  Psychoeducational Skills  Participation Level:  Did Not Attend  Participation Quality:  Did Not Attend  Affect:  Did Not Attend  Cognitive:  Did Not Attend  Insight:  None  Engagement in Group:  Did Not Attend  Modes of Intervention:  Did Not Attend  Summary of Progress/Problems: Pt did not attend patient self inventory group.   Jacquelyne Balint Shanta 07/23/2015, 10:37 AM

## 2015-07-23 NOTE — Progress Notes (Signed)
[facility] MD Progress Note  07/23/2015 1:55 PM Justin [name]  MRN:  [id number] Subjective:   Patient reports " I am better but I have a lot of anxiety, I need my xanax back that will help with my anxiety." Report that a" treatment facility as accepted me in [place], [place]."  Objective : Justin [name] is awake, alert and oriented X3 , found sitting in the dayroom interacting with peers.  Denies suicidal or homicidal ideation. Denies auditory or visual hallucination and does not appear to be responding to internal stimuli. Patient reports he is medication compliant without mediation side effects. States his depression 7/10. Patient states he found a treatment facility in [place] at [facility] " Reports poor appetite .reports resting well thoughout the day. Support, encouragement and reassurance was provided.   Principal Problem:  MDD, PTSD, Alcohol  Dependence, Alcohol WDL  Diagnosis:   Patient Active Problem List   Diagnosis Date Noted  . Generalized anxiety disorder [F41.1] 07/19/2015  . Seizures (HCC) [R56.9] 03/31/2014  . Syncope and collapse [R55] 03/31/2014  . Lower extremity weakness [R29.898] 03/31/2014  . Abdominal pain [R10.9] 11/10/2013  . Drainage from wound [T14.8] 11/10/2013  . PTSD (post-traumatic stress disorder) [F43.10] 01/09/2013  . Alcohol dependence (HCC) [F10.20] 01/08/2013  . Polysubstance abuse [F19.10] 01/08/2013  . ATTENTION DEFICIT DISORDER, ADULT [F98.8] 05/22/2009  . Migraine [G43.909] 05/22/2009  . Crohn disease (HCC) [K50.90] 05/22/2009   Total Time spent with patient: 20 minutes    Past Medical History:  Past Medical History  Diagnosis Date  . Crohn's disease (HCC)   . ADHD (attention deficit hyperactivity disorder)   . Foot fracture, right   . Seizures (HCC)   . Substance abuse   . PTSD (post-traumatic stress disorder)     Past Surgical History  Procedure Laterality Date  . Gallbladder surgery    . Arm surgery     Family History:  Family  History  Problem Relation Age of Onset  . Heart attack Father     Social History:  History  Alcohol Use  . 3.6 - 4.2 oz/week  . 6-7 Cans of beer per week    Comment: DAILY. Last drink: "earlier today"     History  Drug Use No    Social History   Social History  . Marital Status: Single    Spouse Name: N/A  . Number of Children: N/A  . Years of Education: N/A   Social History Main Topics  . Smoking status: Current Every Day Smoker -- 1.00 packs/day    Types: Cigarettes  . Smokeless tobacco: None  . Alcohol Use: 3.6 - 4.2 oz/week    6-7 Cans of beer per week     Comment: DAILY. Last drink: "earlier today"  . Drug Use: No  . Sexual Activity: Not Asked   Other Topics Concern  . None   Social History Narrative   Additional Social History:   Sleep: fair   Appetite:  Fair  Current Medications: Current Facility-Administered Medications  Medication Dose Route Frequency Provider Last Rate Last Dose  . acetaminophen (TYLENOL) tablet 650 mg  650 mg Oral Q6H PRN Earney Navy, NP   650 mg at 07/19/15 2001  . alum & mag hydroxide-simeth (MAALOX/MYLANTA) 200-200-20 MG/5ML suspension 30 mL  30 mL Oral Q4H PRN Earney Navy, NP      . Melene Muller ON 07/24/2015] chlordiazePOXIDE (LIBRIUM) capsule 25 mg  25 mg Oral Daily Kerry Hough, PA-C      .  divalproex (DEPAKOTE ER) 24 hr tablet 500 mg  500 mg Oral QHS Oneta Rack, NP   500 mg at 07/22/15 2145  . hydrOXYzine (ATARAX/VISTARIL) tablet 25 mg  25 mg Oral Q4H PRN Oneta Rack, NP   25 mg at 07/23/15 1131  . magnesium hydroxide (MILK OF MAGNESIA) suspension 30 mL  30 mL Oral Daily PRN Earney Navy, NP      . multivitamin with minerals tablet 1 tablet  1 tablet Oral Daily Kerry Hough, PA-C   1 tablet at 07/23/15 0759  . nicotine (NICODERM CQ - dosed in mg/24 hours) patch 21 mg  21 mg Transdermal Daily Rockey Situ Cobos, MD   21 mg at 07/23/15 0700  . pantoprazole (PROTONIX) EC tablet 20 mg  20 mg Oral Daily  Craige Cotta, MD   20 mg at 07/23/15 0759  . prazosin (MINIPRESS) capsule 1 mg  1 mg Oral QHS Craige Cotta, MD   1 mg at 07/22/15 2146  . thiamine (VITAMIN B-1) tablet 100 mg  100 mg Oral Daily Kerry Hough, PA-C   100 mg at 07/23/15 0759  . traZODone (DESYREL) tablet 50 mg  50 mg Oral QHS PRN Worthy Flank, NP   50 mg at 07/22/15 2145  . venlafaxine XR (EFFEXOR-XR) 24 hr capsule 225 mg  225 mg Oral Q breakfast Craige Cotta, MD   225 mg at 07/23/15 0759    Lab Results: No results found for this or any previous visit (from the past 48 hour(s)).  Blood Alcohol level:  Lab Results  Component Value Date   [facility] 164* 07/19/2015   ETH <11 08/15/2013    Physical Findings: AIMS: Facial and Oral Movements Muscles of Facial Expression: None, normal Lips and Perioral Area: None, normal Jaw: None, normal Tongue: None, normal,Extremity Movements Upper (arms, wrists, hands, fingers): None, normal Lower (legs, knees, ankles, toes): None, normal, Trunk Movements Neck, shoulders, hips: None, normal, Overall Severity Severity of abnormal movements (highest score from questions above): None, normal Incapacitation due to abnormal movements: None, normal Patient's awareness of abnormal movements (rate only patient's report): No Awareness, Dental Status Current problems with teeth and/or dentures?: No Does patient usually wear dentures?: No  CIWA:  CIWA-Ar Total: 1 COWS:     Musculoskeletal: Strength & Muscle Tone: within normal limits  Gait & Station: normal Patient leans: N/A  Psychiatric Specialty Exam: Review of Systems  Psychiatric/Behavioral: Positive for depression and substance abuse. Negative for suicidal ideas and hallucinations. The patient is nervous/anxious and has insomnia.   All other systems reviewed and are negative.   Blood pressure 110/77, pulse 109, temperature 97.6 F (36.4 C), temperature source Oral, resp. rate 18, height 6\' 2"  (1.88 m), weight 92.534 kg  (204 lb), SpO2 98 %.Body mass index is 26.18 kg/(m^2).  General Appearance: Casual and Fairly Groomed  Eye Contact::  Good  Speech:  Normal Rate  Volume:  Normal  Mood:  Anxious and Depressed  Affect:  Congruent and Depressed  Thought Process:  Linear  Orientation:  Full (Time, Place, and Person)  Thought Content:  denies hallucinations, no delusions, not internally preoccupied   Suicidal Thoughts:  No at this time denies any suicidal ideations, and denies any self injurious ideations   Homicidal Thoughts:  No-  Memory:  recent and remote grossly intact   Judgement:  Fair  Insight:  Fair  Psychomotor Activity:  Restlessness and mild restlessness, positive distal tremors  Concentration:  Good  Recall:  [place] Major of Knowledge:Good  Language: Good  Akathisia:  Negative  Handed:  Right  AIMS (if indicated):     Assets:  Desire for Improvement Resilience  ADL's:  Intact  Cognition: WNL  Sleep:  Number of Hours: 6.75   I agree with current treatment plan on 07/23/2015, Patient seen face-to-face for psychiatric evaluation follow-up, chart reviewed. Reviewed the information documented and agree with the treatment plan.  Treatment Plan Summary: Daily contact with patient to assess and evaluate symptoms and progress in treatment, Medication management, Plan inpatient treatment  and medications as  below   1. Encourage  Milieu and group participation to work on coping skills and symptom reduction 2. Continue Librium detox taper to address alcohol WDL symptoms -completed  3. Continue Minipress 1 mgr QHS for PTSD related nightmares  4. Continue Effexor XR  225 mgrs QDAY for depression and PTSD  5.Start Vistaril 25 mg PO Q 4 for anxiety  6. Continue Depakote ER   QHS for history of angry outbursts/mood  disorder- consider titrating gradually if well tolerated.  7. Collect Depakote level on 07/24/2015  Oneta Rack, NP 07/23/2015, 1:55 PM I agreed with findings and treatment plan  of this patient

## 2015-07-24 DIAGNOSIS — F411 Generalized anxiety disorder: Secondary | ICD-10-CM

## 2015-07-24 LAB — VALPROIC ACID LEVEL: VALPROIC ACID LVL: 25 ug/mL — AB (ref 50.0–100.0)

## 2015-07-24 NOTE — Progress Notes (Signed)
Patient ID: Justin [name], male   DOB: 29-Jul-1985, 30 y.o.   MRN: [id number]  DAR: Pt. Denies SI/HI and A/V Hallucinations. He reports sleep is good, appetite is poor, energy level is normal, and concentration is good. He rates depression and anxiety 2/10, and hopelessness 0/10. Patient does not report any pain or discomfort at this time. He appears tired today, minimal with Clinical research associate. Support and encouragement provided to the patient. Scheduled medications administered to patient per physician's orders. He reports some anxiety and received PRN Vistaril for this. Q15 minute checks are maintained for safety.

## 2015-07-24 NOTE — Progress Notes (Signed)
Patient ID: Justin [name], male   DOB: 1986-01-26, 30 y.o.   MRN: [id number] [facility] MD Progress Note  07/24/2015 4:58 PM Justin [name]  MRN:  [id number]  Subjective: Justin [name] reports, "I feel better. The homicidal thoughts are getting better. I'm ready to go home or to a treatment center".  Objective : Justin [name] is awake, alert and oriented X 3, he was lying down in his bed during this assessment. He currently denies any suicidal/homicidal ideation,  Auditory/visual hallucination. Justin [name] does not appear to be responding to internal stimuli. He is taking his medications without adverse effects & or reactions. He states that his depression 5/10. He says he is ready to start substance abuse treatment as soon as he can get into a treatment center. He hopes to leave by the hospital by Wednesday.  Principal Problem:  MDD, PTSD, Alcohol  Dependence, Alcohol WDL   Diagnosis:   Patient Active Problem List   Diagnosis Date Noted  . Generalized anxiety disorder [F41.1] 07/19/2015  . Seizures (HCC) [R56.9] 03/31/2014  . Syncope and collapse [R55] 03/31/2014  . Lower extremity weakness [R29.898] 03/31/2014  . Abdominal pain [R10.9] 11/10/2013  . Drainage from wound [T14.8] 11/10/2013  . PTSD (post-traumatic stress disorder) [F43.10] 01/09/2013  . Alcohol dependence (HCC) [F10.20] 01/08/2013  . Polysubstance abuse [F19.10] 01/08/2013  . ATTENTION DEFICIT DISORDER, ADULT [F98.8] 05/22/2009  . Migraine [G43.909] 05/22/2009  . Crohn disease (HCC) [K50.90] 05/22/2009   Total Time spent with patient: 15 minutes  Past Medical History:  Past Medical History  Diagnosis Date  . Crohn's disease (HCC)   . ADHD (attention deficit hyperactivity disorder)   . Foot fracture, right   . Seizures (HCC)   . Substance abuse   . PTSD (post-traumatic stress disorder)     Past Surgical History  Procedure Laterality Date  . Gallbladder surgery    . Arm surgery     Family History:  Family History  Problem  Relation Age of Onset  . Heart attack Father    Social History:  History  Alcohol Use  . 3.6 - 4.2 oz/week  . 6-7 Cans of beer per week    Comment: DAILY. Last drink: "earlier today"     History  Drug Use No    Social History   Social History  . Marital Status: Single    Spouse Name: N/A  . Number of Children: N/A  . Years of Education: N/A   Social History Main Topics  . Smoking status: Current Every Day Smoker -- 1.00 packs/day    Types: Cigarettes  . Smokeless tobacco: None  . Alcohol Use: 3.6 - 4.2 oz/week    6-7 Cans of beer per week     Comment: DAILY. Last drink: "earlier today"  . Drug Use: No  . Sexual Activity: Not Asked   Other Topics Concern  . None   Social History Narrative   Additional Social History:   Sleep: Good  Appetite:  Fair  Current Medications: Current Facility-Administered Medications  Medication Dose Route Frequency Provider Last Rate Last Dose  . acetaminophen (TYLENOL) tablet 650 mg  650 mg Oral Q6H PRN Earney Navy, NP   650 mg at 07/19/15 2001  . alum & mag hydroxide-simeth (MAALOX/MYLANTA) 200-200-20 MG/5ML suspension 30 mL  30 mL Oral Q4H PRN Earney Navy, NP      . divalproex (DEPAKOTE ER) 24 hr tablet 500 mg  500 mg Oral QHS Oneta Rack, NP   500 mg at 07/23/15  2122  . hydrOXYzine (ATARAX/VISTARIL) tablet 25 mg  25 mg Oral Q4H PRN Oneta Rack, NP   25 mg at 07/24/15 1156  . magnesium hydroxide (MILK OF MAGNESIA) suspension 30 mL  30 mL Oral Daily PRN Earney Navy, NP      . multivitamin with minerals tablet 1 tablet  1 tablet Oral Daily Kerry Hough, PA-C   1 tablet at 07/24/15 0816  . nicotine (NICODERM CQ - dosed in mg/24 hours) patch 21 mg  21 mg Transdermal Daily Craige Cotta, MD   21 mg at 07/24/15 0818  . ondansetron (ZOFRAN) tablet 4 mg  4 mg Oral Q8H PRN Worthy Flank, NP   4 mg at 07/23/15 2153  . pantoprazole (PROTONIX) EC tablet 20 mg  20 mg Oral Daily Craige Cotta, MD   20 mg at  07/24/15 0816  . prazosin (MINIPRESS) capsule 1 mg  1 mg Oral QHS Craige Cotta, MD   1 mg at 07/23/15 2122  . thiamine (VITAMIN B-1) tablet 100 mg  100 mg Oral Daily Kerry Hough, PA-C   100 mg at 07/24/15 1610  . traZODone (DESYREL) tablet 50 mg  50 mg Oral QHS PRN Worthy Flank, NP   50 mg at 07/23/15 2122  . venlafaxine XR (EFFEXOR-XR) 24 hr capsule 225 mg  225 mg Oral Q breakfast Craige Cotta, MD   225 mg at 07/24/15 9604   Lab Results:  Results for orders placed or performed during the hospital encounter of 07/19/15 (from the past 48 hour(s))  Valproic acid level     Status: Abnormal   Collection Time: 07/24/15  6:33 AM  Result Value Ref Range   Valproic Acid Lvl 25 (L) 50.0 - 100.0 ug/mL    Comment: Performed at [facility]   Blood Alcohol level:  Lab Results  Component Value Date   [facility] 164* 07/19/2015   ETH <11 08/15/2013   Physical Findings: AIMS: Facial and Oral Movements Muscles of Facial Expression: None, normal Lips and Perioral Area: None, normal Jaw: None, normal Tongue: None, normal,Extremity Movements Upper (arms, wrists, hands, fingers): None, normal Lower (legs, knees, ankles, toes): None, normal, Trunk Movements Neck, shoulders, hips: None, normal, Overall Severity Severity of abnormal movements (highest score from questions above): None, normal Incapacitation due to abnormal movements: None, normal Patient's awareness of abnormal movements (rate only patient's report): No Awareness, Dental Status Current problems with teeth and/or dentures?: No Does patient usually wear dentures?: No  CIWA:  CIWA-Ar Total: 2 COWS:     Musculoskeletal: Strength & Muscle Tone: within normal limits  Gait & Station: normal Patient leans: N/A  Psychiatric Specialty Exam: Review of Systems  Constitutional: Negative.   HENT: Negative.   Eyes: Negative.   Respiratory: Negative.   Cardiovascular: Negative.   Gastrointestinal: Negative.    Genitourinary: Negative.   Musculoskeletal: Negative.   Skin: Negative.   Neurological: Negative.   Endo/Heme/Allergies: Negative.   Psychiatric/Behavioral: Positive for depression and substance abuse. Negative for suicidal ideas and hallucinations. The patient is nervous/anxious and has insomnia.   All other systems reviewed and are negative.   Blood pressure 128/70, pulse 83, temperature 97.8 F (36.6 C), temperature source Oral, resp. rate 16, height  (1.88 m), weight 92.534 kg (204 lb), SpO2 98 %.Body mass index is 26.18 kg/(m^2).  General Appearance: Casual and Fairly Groomed  Eye Contact::  Good  Speech:  Normal Rate  Volume:  Normal  Mood:  Anxious and Depressed  Affect:  Congruent and Depressed  Thought Process:  Linear  Orientation:  Full (Time, Place, and Person)  Thought Content:  denies hallucinations, no delusions, not internally preoccupied   Suicidal Thoughts:  No at this time denies any suicidal ideations, and denies any self injurious ideations   Homicidal Thoughts:  No-  Memory:  recent and remote grossly intact   Judgement:  Fair  Insight:  Fair  Psychomotor Activity:  Restlessness and mild restlessness, positive distal tremors  Concentration:  Good  Recall:  Good  Fund of Knowledge:Good  Language: Good  Akathisia:  Negative  Handed:  Right  AIMS (if indicated):     Assets:  Desire for Improvement Resilience  ADL's:  Intact  Cognition: WNL  Sleep:  Number of Hours: 6.75   ITreatment Plan Summary: Daily contact with patient to assess and evaluate symptoms and progress in treatment, Medication management, Plan inpatient treatment  and medications as  below : 1. Encourage  Milieu and group participation to work on coping skills and symptom reduction 2.Completed Librium detox protocols, however continue to complain of high anxiety levels  3. Continue Minipress 1 mgr QHS for PTSD related nightmares  4. Continue Effexor XR  225 mgrs QDAY for depression  and PTSD  5.Continue Vistaril 25 mg PO Q 4 for anxiety  6. Continue Depakote ER  500mg  QHS for history of angry outbursts/mood  disorder- consider titrating gradually if well tolerated.  7. Collect Depakote level on 07/24/2015, will review lab results when available.  Armandina Stammer I, NP, PMHNP-BC 07/24/2015, 4:58 PM

## 2015-07-24 NOTE — [facility] Group Notes (Signed)
[facility] LCSW Aftercare Discharge Planning Group Note  07/24/2015 8:45 AM  Pt did not attend, declined invitation.   Chad Cordial, LCSWA 07/24/2015 3:50 PM

## 2015-07-24 NOTE — Progress Notes (Signed)
Group Note  Did not attend group.

## 2015-07-24 NOTE — Progress Notes (Signed)
D: Pt was resting in bed in his room up on initial approach.  He forwards little information to Clinical research associate.  Pt states "I don't feel good" and reported feeling nauseous to Clinical research associate.  Pt denies SI/HI, denies hallucinations, denies pain.  He has remained in his room for the majority of the night and he did not attend evening group.    A: Introduced self to pt.  Medications administered per order.  On-call provider notified that pt reported nausea and PRN medication was ordered and administered for nausea.  PRN medication administered for sleep.  PO fluids encouraged and provided.    R: Pt is compliant with medication.  He verbally contracts for safety.  Will continue to monitor and assess.

## 2015-07-24 NOTE — Progress Notes (Signed)
D:Patient in bed on approach.  Patient states he has been sleeping a lot.  Patient states he had a good day otherwise.  Patient did not engage in conversation with Clinical research associate. Patient denies SI/HI and denies AVH. A: Staff to monitor Q 15 mins for safety.  Encouragement and support offered.  Scheduled medications administered per orders.  Trazodone administered prn for sleep. R: Patient remains safe on the unit.  Patient attended group tonight.  Patient visible on the unit for medications and group tonight.  Patient taking administered medications.

## 2015-07-24 NOTE — [facility] Group Notes (Signed)
[facility] LCSW Group Therapy  07/24/2015 1:15pm  Type of Therapy:  Group Therapy vercoming Obstacles  Pt did not attend, declined invitation.   Chad Cordial, LCSWA 07/24/2015 3:51 PM

## 2015-07-25 MED ORDER — VENLAFAXINE HCL ER 75 MG PO CP24: 225.0000 mg | ORAL_CAPSULE | Freq: Every day | ORAL | Status: AC

## 2015-07-25 MED ORDER — PRAZOSIN HCL 1 MG PO CAPS: 1.0000 mg | ORAL_CAPSULE | Freq: Every day | ORAL | Status: AC

## 2015-07-25 MED ORDER — DIVALPROEX SODIUM ER 250 MG PO TB24
750.0000 mg | ORAL_TABLET | Freq: Every day | ORAL | Status: DC
  Filled 2015-07-25: qty 3
  Filled 2015-07-25: qty 21

## 2015-07-25 MED ORDER — TRAZODONE HCL 50 MG PO TABS: 50.0000 mg | ORAL_TABLET | Freq: Every evening | ORAL | Status: AC | PRN

## 2015-07-25 MED ORDER — DIVALPROEX SODIUM ER 250 MG PO TB24: 750.0000 mg | ORAL_TABLET | Freq: Every day | ORAL | Status: AC

## 2015-07-25 MED ORDER — HYDROXYZINE HCL 25 MG PO TABS: 25.0000 mg | ORAL_TABLET | ORAL | Status: AC | PRN

## 2015-07-25 MED ORDER — VENLAFAXINE HCL ER 75 MG PO CP24
225.0000 mg | ORAL_CAPSULE | Freq: Every day | ORAL | Status: DC
  Filled 2015-07-25 (×2): qty 21

## 2015-07-25 MED ORDER — NICOTINE 21 MG/24HR TD PT24: 21.0000 mg | MEDICATED_PATCH | Freq: Every day | TRANSDERMAL | Status: AC

## 2015-07-25 NOTE — BHH Group Notes (Signed)
[facility] Group Notes:  (Nursing/MHT/Case Management/Adjunct)  Date:  07/25/2015  Time:  11:32 AM  Type of Therapy:  Psychoeducational Skills  Participation Level:  Did Not Attend  Participation Quality:  N/A  Affect:  N/A  Cognitive:  N/A  Insight:  None  Engagement in Group:  None  Modes of Intervention:  Discussion and Education  Summary of Progress/Problems: Patient was invited to group but chose not to attend.   Marzetta Board E 07/25/2015, 11:32 AM

## 2015-07-25 NOTE — Tx Team (Signed)
Interdisciplinary Treatment Plan Update (Adult) Date: 07/25/2015   Date: 07/25/2015 1:08 PM  Progress in Treatment:  Attending groups: No Participating in groups: No  Taking medication as prescribed: Yes  Tolerating medication: Yes  Family/Significant othe contact made:  Yes, with ex-girlfriend Patient understands diagnosis: Yes AEB seeking help with PTSD Discussing patient identified problems/goals with staff: Yes  Medical problems stabilized or resolved: Yes  Denies suicidal/homicidal ideation: Yes Patient has not harmed self or Others: Yes   New problem(s) identified: None identified at this time.   Discharge Plan or Barriers: Pt requests transfer to [facility]. If unable to do this, he will return home to [place] and follow-up with outpatient providers   07/25/15: Pt is going to the [facility] program in [place] for veterans with PTSD. Pt will use the facility to help enroll in [place] system.  Additional comments:  Patient and CSW reviewed pt's identified goals and treatment plan. Patient verbalized understanding and agreed to treatment plan. CSW reviewed [facility] "Discharge Process and Patient Involvement" Form. Pt verbalized understanding of information provided and signed form.   Reason for Continuation of Hospitalization:  Anxiety Depression Medication stabilization Withdrawal symptoms  Estimated length of stay: 0 days  Review of initial/current patient goals per problem list:   1.  Goal(s): Patient will participate in aftercare plan  Met:  Progressing  Target date: 3-5 days from date of admission   As evidenced by: Patient will participate within aftercare plan AEB aftercare provider and housing plan at discharge being identified.   07/20/15:  Pt requests transfer to [facility]. If unable to do this, he will return home to [place] and follow-up with outpatient providers 07/25/2015: Pt is going to the [facility] [place] program in [place] for veterans with PTSD. Pt will use the facility to help enroll  in [place] system.  2.  Goal (s): Patient will exhibit decreased depressive symptoms and suicidal ideations.  Met:  Yes  Target date: 3-5 days from date of admission   As evidenced by: Patient will utilize self rating of depression at 3 or below and demonstrate decreased signs of depression or be deemed stable for discharge by MD.  07/20/15: Pt rates depression at 5/10; denies SI  07/25/15: Pt rates depression at 2/10; denies SI  3.  Goal(s): Patient will demonstrate decreased signs and symptoms of anxiety.  Met:  Yes  Target date: 3-5 days from date of admission   As evidenced by: Patient will utilize self rating of anxiety at 3 or below and demonstrated decreased signs of anxiety, or be deemed stable for discharge by MD 07/20/15: Pt was admitted with increased levels of anxiety and is currently rating those symptoms highly. Pt will demonstrated decreased symptoms of anxiety and rate it at 3/10 prior to d/c. 07/25/2015 Pt rates anxiety at 2/10 4.  Goal(s): Patient will demonstrate decreased signs of withdrawal due to substance abuse  Met:  Yes  Target date: 3-5 days from date of admission   As evidenced by: Patient will produce a CIWA/COWS score of 0, have stable vitals signs, and no symptoms of withdrawal  07/20/15: Pt has CIWA score of 8, endorses tremor, sweats, anxiety, headache, and agitation  07/25/15: Pt denies symptoms of withdrawal. CIWA score of 0  Attendees:  Patient:    Family:    Physician: Dr. Parke Poisson, MD  07/25/2015 1:08 PM  Nursing: Lars Pinks, RN Case manager  07/25/2015 1:08 PM  Clinical Social Worker Peri Maris, [place] 07/25/2015 1:08 PM  Other: Erasmo Downer  [place], LCSWA 07/25/2015 1:08 PM  Clinical: Darrol Angel RN; Gaylan Gerold, RN 07/25/2015 1:08 PM  Other: , RN Charge Nurse 07/25/2015 1:08 PM  Other: Hilda Lias, [place], [place] Social Work [phone]

## 2015-07-25 NOTE — BHH Suicide Risk Assessment (Addendum)
[facility] Discharge Suicide Risk Assessment   Principal Problem:  PTSD, Alcohol Dependence  Discharge Diagnoses:  Patient Active Problem List   Diagnosis Date Noted  . Generalized anxiety disorder [F41.1] 07/19/2015  . Seizures (HCC) [R56.9] 03/31/2014  . Syncope and collapse [R55] 03/31/2014  . Lower extremity weakness [R29.898] 03/31/2014  . Abdominal pain [R10.9] 11/10/2013  . Drainage from wound [T14.8] 11/10/2013  . PTSD (post-traumatic stress disorder) [F43.10] 01/09/2013  . Alcohol dependence (HCC) [F10.20] 01/08/2013  . Polysubstance abuse [F19.10] 01/08/2013  . ATTENTION DEFICIT DISORDER, ADULT [F98.8] 05/22/2009  . Migraine [G43.909] 05/22/2009  . Crohn disease (HCC) [K50.90] 05/22/2009    Total Time spent with patient: 30 minutes  Musculoskeletal: Strength & Muscle Tone: within normal limits Gait & Station: normal Patient leans: N/A  Psychiatric Specialty Exam: ROS  Blood pressure 90/60, pulse 94, temperature 97.8 F (36.6 C), temperature source Oral, resp. rate 18, height 6\' 2"  (1.88 m), weight 204 lb (92.534 kg), SpO2 98 %.Body mass index is 26.18 kg/(m^2).  General Appearance: improved grooming   Eye Contact::  Good  Speech:  Normal Rate409  Volume:  Normal  Mood:  improved   Affect:  Appropriate and more reactive   Thought Process:  Linear  Orientation:  Full (Time, Place, and Person)  Thought Content:  no hallucinations, no delusions   Suicidal Thoughts:  No- denies any suicidal ideations, denies any self injurious ideations  Homicidal Thoughts:  No- specifically also  denies any homicidal ideations towards brother   Memory:  recent and remote grossly intact   Judgement:  Other:  improved   Insight:  improving   Psychomotor Activity:  Normal  Concentration:  Good  Recall:  Good  Fund of Knowledge:Good  Language: Good  Akathisia:  Negative  Handed:  Right  AIMS (if indicated):     Assets:  Communication Skills Desire for Improvement Resilience  Sleep:   Number of Hours: 6.5  Cognition: WNL  ADL's:  Intact   Mental Status Per Nursing Assessment::   On Admission:  NA  Demographic Factors:  30 year old single male,  No children, [employer] Veteran   Loss Factors: Medication non compliance, strained relationship with brother   Historical Factors: History of PTSD, history of depression, history of alcohol dependence   Risk Reduction Factors:   Positive coping skills or problem solving skills  Continued Clinical Symptoms:  At this time patient presents with improved mood, range of affect, no SI, no HI, no psychotic symptoms, no ongoing WDL symptoms, vitals stable . Denies medication side effects Of note, Valproic Acid level subtherapeutic- no side effects- titrate dose to 750 mgrs QHS .   Cognitive Features That Contribute To Risk:  No gross cognitive deficits noted upon discharge. Is alert , attentive, and oriented x 3   Suicide Risk:  Mild:  Suicidal ideation of limited frequency, intensity, duration, and specificity.  There are no identifiable plans, no associated intent, mild dysphoria and related symptoms, good self-control (both objective and subjective assessment), few other risk factors, and identifiable protective factors, including available and accessible social support.    Plan Of Care/Follow-up recommendations:  Activity:  as tolerated Diet:  Regular Tests:  NA Other:  See below  Patient is leaving unit in good spirits Plans to go to Residential setting in [place]- ( [facility] in [place]- leaves tomorrow. States [facility] .  Nehemiah Massed, MD 07/25/2015, 10:59 AM

## 2015-07-25 NOTE — Progress Notes (Signed)
Discharge note:  Patient discharged home per MD order.  Patient received all personal belongings from unit and locker.  Reviewed patient's discharge instructions and follow up appointments.  He received medication samples and prescriptions.  He denies SI/HI/AVH.  Patient left ambulatory for the bus station.  He left in good spirits.

## 2015-07-25 NOTE — Progress Notes (Signed)
  [name] Adult Case Management Discharge Plan :  Will you be returning to the same living situation after discharge:  No. Pt reports that he will go to [facility] program in [place] At discharge, do you have transportation home?: Yes,  Pt given a bus pass Do you have the ability to pay for your medications: Yes,  Pt provided with prescriptions and supply  Release of information consent forms completed and in the chart;  Patient's signature needed at discharge.  Patient to Follow up at: Follow-up Information    Follow up with [facility] On 07/26/2015.   Why:  Please go on 2/22 for admission to this program. Here, staff will help you enroll at the [facility] and the community clinic.   Contact information:   [address] [place], [place] [address] [phone]      Next level of care provider has access to [facility] Link:no  Safety Planning and Suicide Prevention discussed: Yes,  with ex-girlfriend; see SPE note for further details  Have you used any form of tobacco in the last 30 days? (Cigarettes, Smokeless Tobacco, Cigars, and/or Pipes): Yes  Has patient been referred to the Quitline?: Patient refused referral  Patient has been referred for addiction treatment: Yes- see above  Justin [name] 07/25/2015, 1:11 PM

## 2015-07-25 NOTE — Discharge Summary (Signed)
Physician Discharge Summary Note  Patient:  Justin [name] is an 30 y.o., male MRN:  [id number] DOB:  12-15-1985 Patient phone:  [phone] (home)  Patient address:   [address] [place] [address],  Total Time spent with patient: 30 minutes  Date of Admission:  07/19/2015 Date of Discharge: 07/25/2015  Reason for Admission:  Depression and anxiety  Principal Problem: PTSD (post-traumatic stress disorder) Discharge Diagnoses: Patient Active Problem List   Diagnosis Date Noted  . PTSD (post-traumatic stress disorder) [F43.10] 01/09/2013    Priority: High  . Generalized anxiety disorder [F41.1] 07/19/2015  . Seizures (HCC) [R56.9] 03/31/2014  . Syncope and collapse [R55] 03/31/2014  . Lower extremity weakness [R29.898] 03/31/2014  . Abdominal pain [R10.9] 11/10/2013  . Drainage from wound [T14.8] 11/10/2013  . Alcohol dependence (HCC) [F10.20] 01/08/2013  . Polysubstance abuse [F19.10] 01/08/2013  . ATTENTION DEFICIT DISORDER, ADULT [F98.8] 05/22/2009  . Migraine [G43.909] 05/22/2009  . Crohn disease (HCC) [K50.90] 05/22/2009    Past Psychiatric History:  See above noted  Past Medical History:  Past Medical History  Diagnosis Date  . Crohn's disease (HCC)   . ADHD (attention deficit hyperactivity disorder)   . Foot fracture, right   . Seizures (HCC)   . Substance abuse   . PTSD (post-traumatic stress disorder)     Past Surgical History  Procedure Laterality Date  . Gallbladder surgery    . Arm surgery     Family History:  Family History  Problem Relation Age of Onset  . Heart attack Father    Family Psychiatric  History:  Denied Social History:  History  Alcohol Use  . 3.6 - 4.2 oz/week  . 6-7 Cans of beer per week    Comment: DAILY. Last drink: "earlier today"     History  Drug Use No    Social History   Social History  . Marital Status: Single    Spouse Name: N/A  . Number of Children: N/A  . Years of Education: N/A    Social History Main Topics  . Smoking status: Current Every Day Smoker -- 1.00 packs/day    Types: Cigarettes  . Smokeless tobacco: None  . Alcohol Use: 3.6 - 4.2 oz/week    6-7 Cans of beer per week     Comment: DAILY. Last drink: "earlier today"  . Drug Use: No  . Sexual Activity: Not Asked   Other Topics Concern  . None   Social History Narrative    Hospital Course:  Justin [name], 30 year old male came in with worsening depression and anxiety over a period of several months due to being off medications.  Justin [name] was admitted for PTSD (post-traumatic stress disorder) and crisis management.  He was treated with the following medications as listed below.  Justin [name] was discharged with current medication and was instructed on how to take medications as prescribed; (details listed below under Medication List).  Medical problems were identified and treated as needed.  Home medications were restarted as appropriate.  Improvement was monitored by observation and Justin [name] daily report of symptom reduction.  Emotional and mental status was monitored by daily self-inventory reports completed by Justin [name] and clinical staff.         Justin [name] was evaluated by the treatment team for stability and plans for continued recovery upon discharge.  Justin [name] motivation was an integral factor for scheduling further treatment.  Employment, transportation, bed  availability, health status, family support, and any pending legal issues were also considered during his hospital stay.  He was offered further treatment options upon discharge including but not limited to Residential, Intensive Outpatient, and Outpatient treatment.  Justin [name] will follow up with the services as listed below under Follow Up Information.     Upon completion of this admission the Justin [name] was both mentally and medically stable for discharge denying suicidal/homicidal  ideation, auditory/visual/tactile hallucinations, delusional thoughts and paranoia.     Physical Findings: AIMS: Facial and Oral Movements Muscles of Facial Expression: None, normal Lips and Perioral Area: None, normal Jaw: None, normal Tongue: None, normal,Extremity Movements Upper (arms, wrists, hands, fingers): None, normal Lower (legs, knees, ankles, toes): None, normal, Trunk Movements Neck, shoulders, hips: None, normal, Overall Severity Severity of abnormal movements (highest score from questions above): None, normal Incapacitation due to abnormal movements: None, normal Patient's awareness of abnormal movements (rate only patient's report): No Awareness, Dental Status Current problems with teeth and/or dentures?: No Does patient usually wear dentures?: No  CIWA:  CIWA-Ar Total: 2 COWS:     Musculoskeletal: Strength & Muscle Tone: within normal limits Gait & Station: normal Patient leans: N/A  Psychiatric Specialty Exam:  SEE MD SRA Review of Systems  All other systems reviewed and are negative.   Blood pressure 90/60, pulse 94, temperature 97.8 F (36.6 C), temperature source Oral, resp. rate 18, height  (1.88 m), weight 92.534 kg (204 lb), SpO2 98 %.Body mass index is 26.18 kg/(m^2).  Have you used any form of tobacco in the last 30 days? (Cigarettes, Smokeless Tobacco, Cigars, and/or Pipes): Yes  Has this patient used any form of tobacco in the last 30 days? (Cigarettes, Smokeless Tobacco, Cigars, and/or Pipes) Yes, given  Blood Alcohol level:  Lab Results  Component Value Date   [facility] 164* 07/19/2015   ETH <11 08/15/2013    Metabolic Disorder Labs:  No results found for: HGBA1C, MPG No results found for: PROLACTIN No results found for: CHOL, TRIG, HDL, CHOLHDL, VLDL, LDLCALC  See Psychiatric Specialty Exam and Suicide Risk Assessment completed by Attending Physician prior to discharge.  Discharge destination:  Home  Is patient on multiple antipsychotic  therapies at discharge:  No   Has Patient had three or more failed trials of antipsychotic monotherapy by history:  No  Recommended Plan for Multiple Antipsychotic Therapies: NA     Medication List    STOP taking these medications        ALPRAZolam 1 MG tablet  Commonly known as:  XANAX     VERAPAMIL HCL ER (CO) PO      TAKE these medications      Indication   divalproex 250 MG 24 hr tablet  Commonly known as:  DEPAKOTE ER  Take 3 tablets (750 mg total) by mouth at bedtime.   Indication:  mood stabilization     hydrOXYzine 25 MG tablet  Commonly known as:  ATARAX/VISTARIL  Take 1 tablet (25 mg total) by mouth every 4 (four) hours as needed for anxiety.   Indication:  Anxiety Neurosis     nicotine 21 mg/24hr patch  Commonly known as:  NICODERM CQ - dosed in mg/24 hours  Place 1 patch (21 mg total) onto the skin daily.   Indication:  Nicotine Addiction     prazosin 1 MG capsule  Commonly known as:  MINIPRESS  Take 1 capsule (1 mg total) by mouth at bedtime.   Indication:  agitation     traZODone 50 MG tablet  Commonly known as:  DESYREL  Take 1 tablet (50 mg total) by mouth at bedtime as needed for sleep.   Indication:  Trouble Sleeping     venlafaxine XR 75 MG 24 hr capsule  Commonly known as:  EFFEXOR-XR  Take 3 capsules (225 mg total) by mouth daily with breakfast.   Indication:  Generalized Anxiety Disorder       Follow-up Information    Follow up with [facility] On 07/26/2015.   Why:  Please go on 2/22 for admission to this program. Here, staff will help you enroll at the [facility] and the community clinic.   Contact information:   [address] [place], [place] [address] [phone]      Follow-up recommendations:  Activity:  as tol Diet:  as tol  Comments:  1.  Take all your medications as prescribed.              2.  Report any adverse side effects to outpatient provider.                       3.  Patient instructed to  not use alcohol or illegal drugs while on prescription medicines.            4.  In the event of worsening symptoms, instructed patient to call 911, the crisis hotline or go to nearest emergency room for evaluation of symptoms.  Signed: Lindwood Qua, NP [facility] 07/25/2015, 2:22 PM  Patient seen, Suicide Assessment Completed.  Disposition Plan Reviewed

## 2016-03-07 IMAGING — CR DG CHEST 2V
2 series · 2 of 2 positions shown · non-contrast
Comparison: 02/28/2013

CLINICAL DATA: Seizure.

EXAM:
CHEST  2 VIEW

[w chest lat]
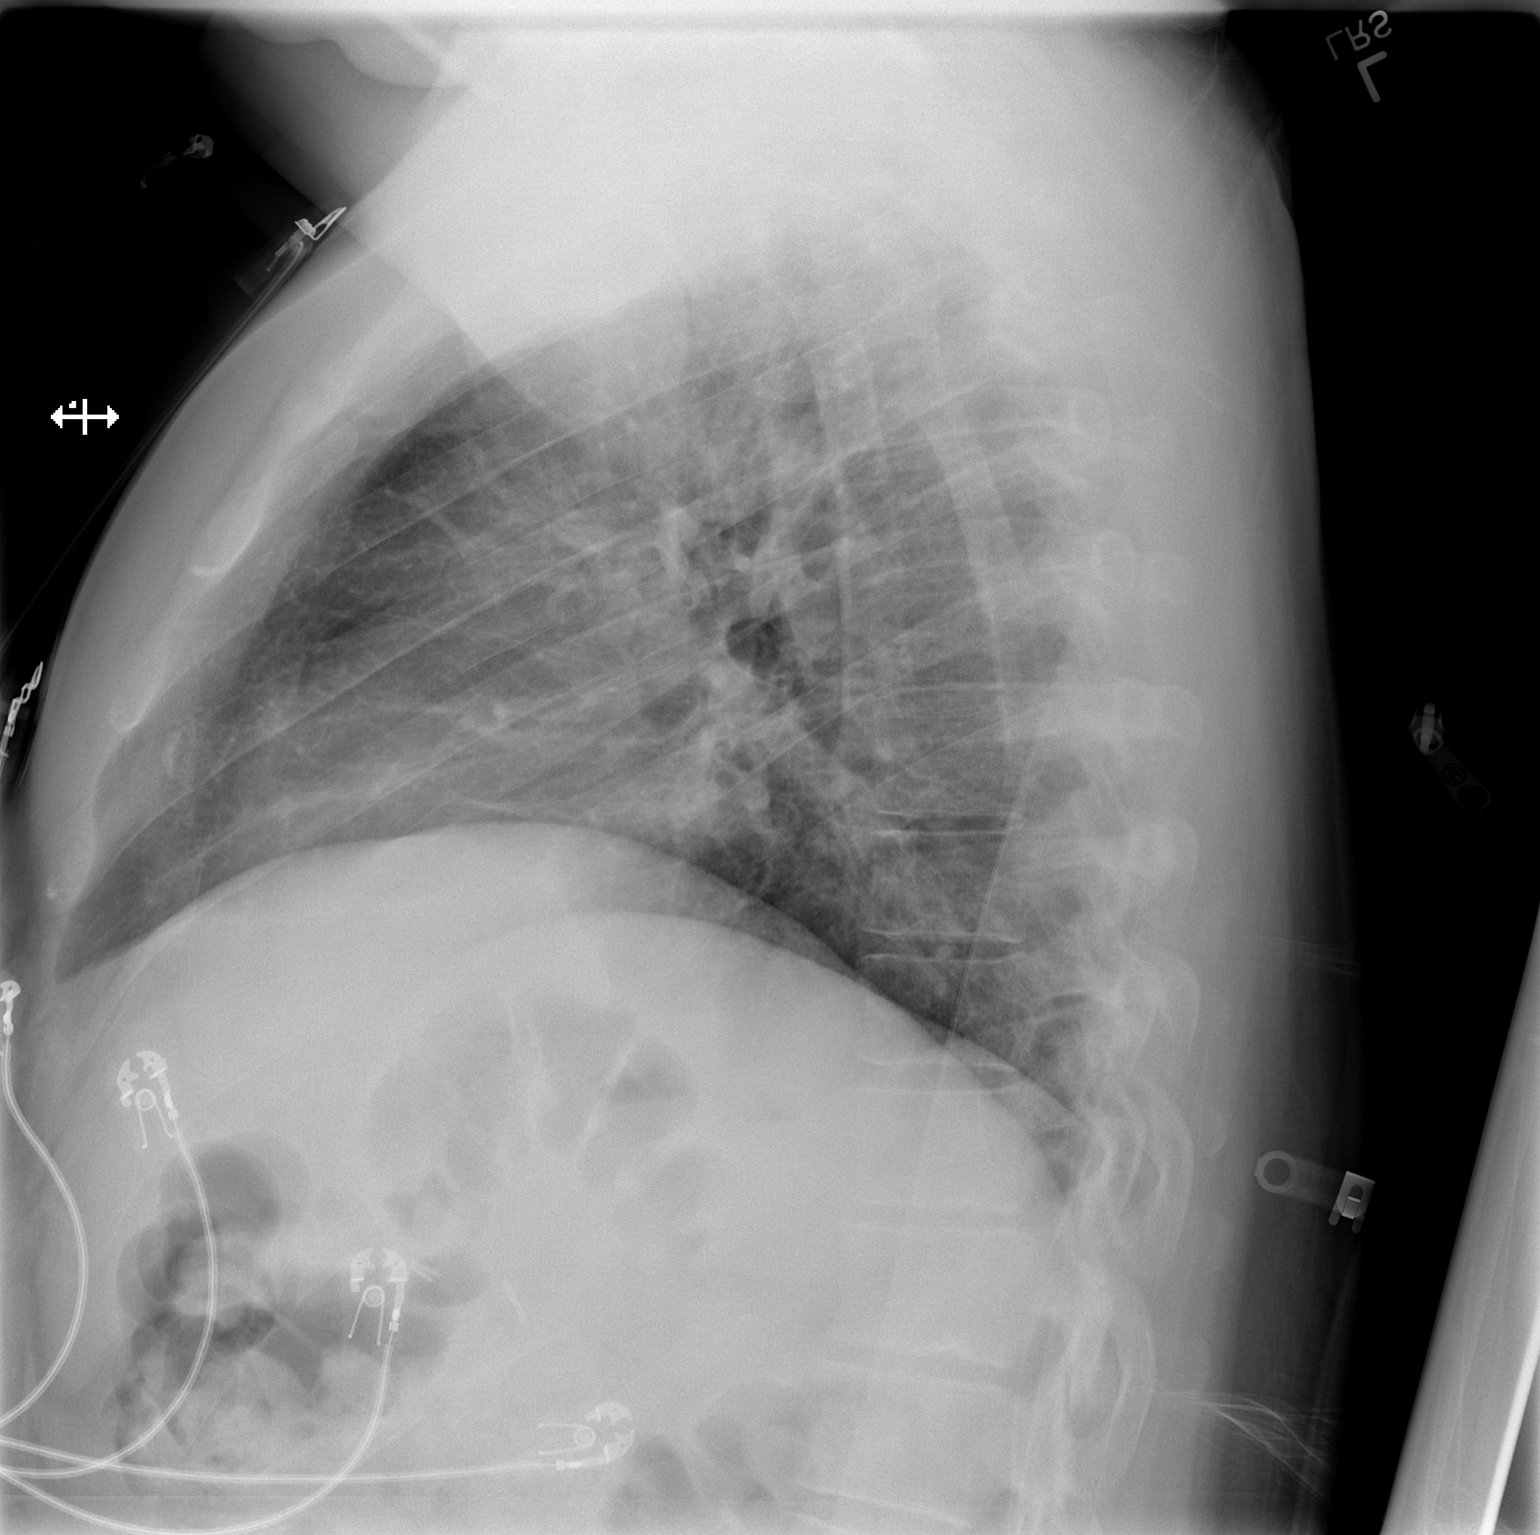

[x chest ap]
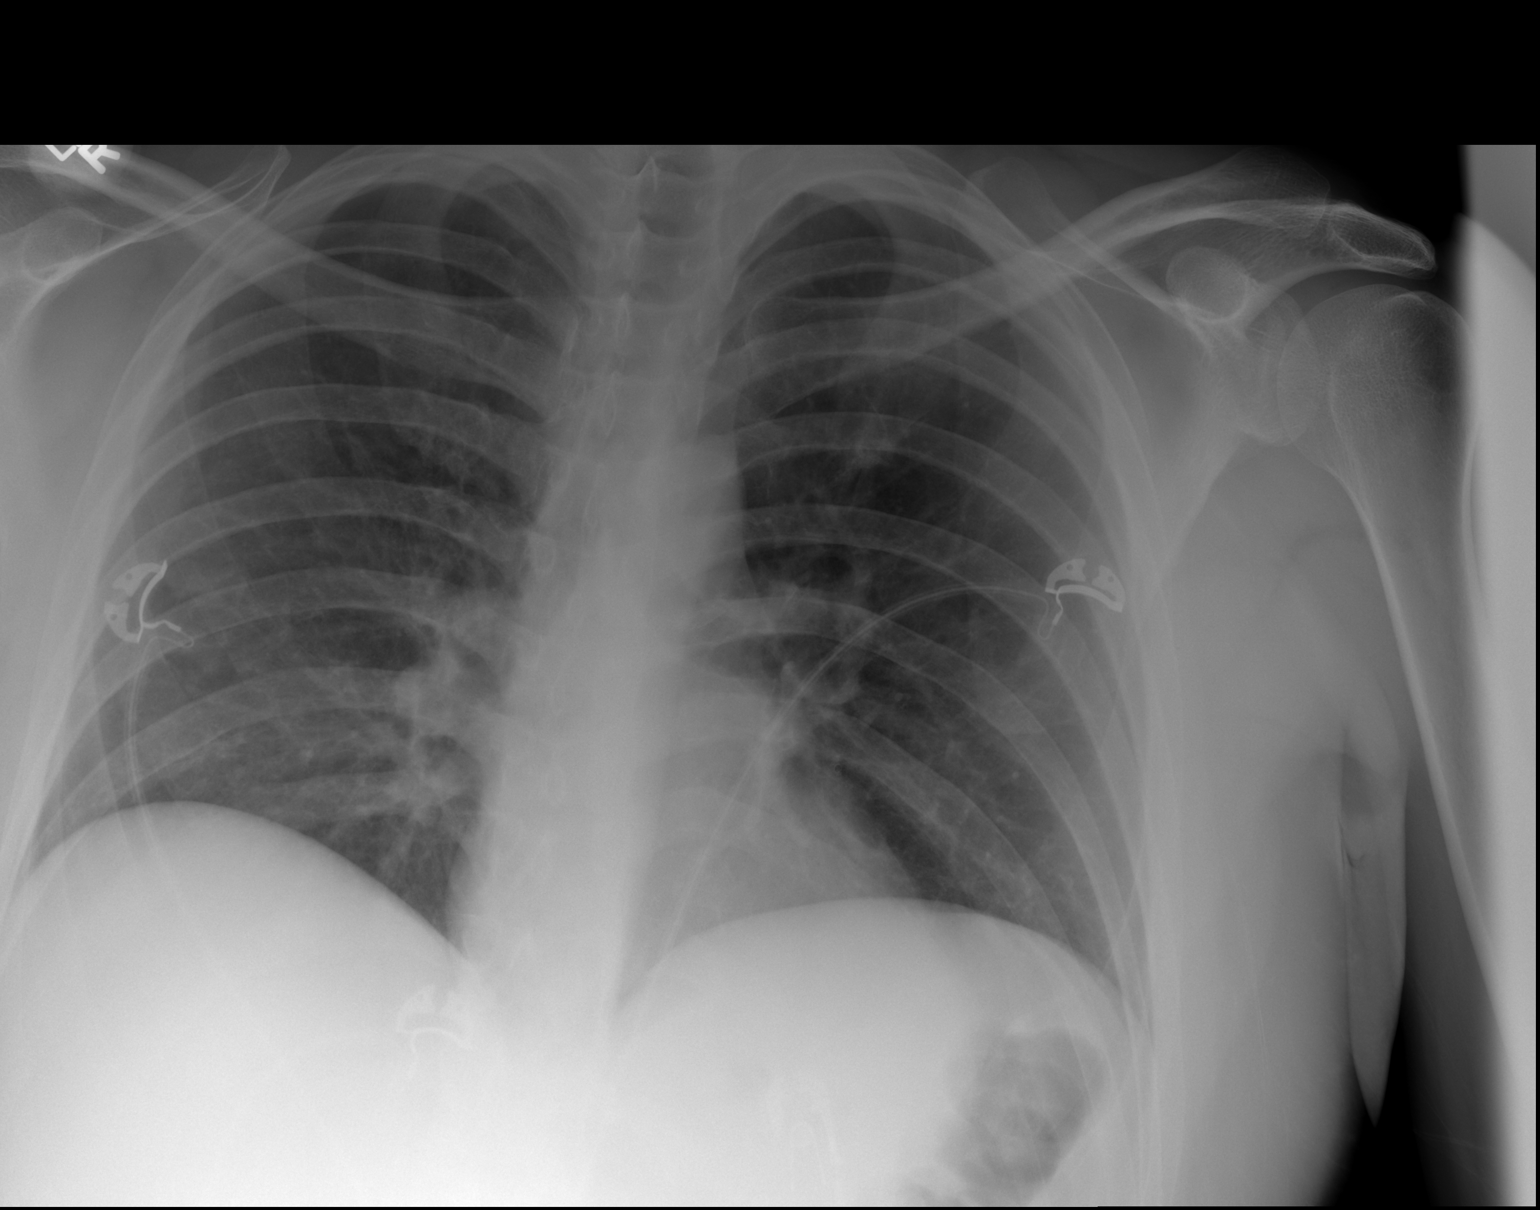

[2 of 2 positions shown; findings below may reference images not displayed]

FINDINGS: Normal heart size and mediastinal contours. Mildly low lung volumes.
No acute infiltrate or edema. No effusion or pneumothorax. No acute
osseous findings.
IMPRESSION: No acute findings.

## 2016-03-07 IMAGING — CT CT L SPINE W/O CM
3 of 4 series · 13 of 33 positions shown, 16 images · non-contrast
Comparison: Prior study from 11/10/2013.

CLINICAL DATA: Initial evaluation for inability to move legs.
Recent seizure.

EXAM:
CT LUMBAR SPINE WITHOUT CONTRAST
TECHNIQUE: Multidetector CT imaging of the lumbar spine was performed without
intravenous contrast administration. Multiplanar CT image
reconstructions were also generated.

[Series 4: l spine 2.0 i30s 3 · axial · 0.28mm/px · z∈[+1042,+1202]mm · 5 of 119 slices shown, 7 images]
[im 20/119  soft-tissue]
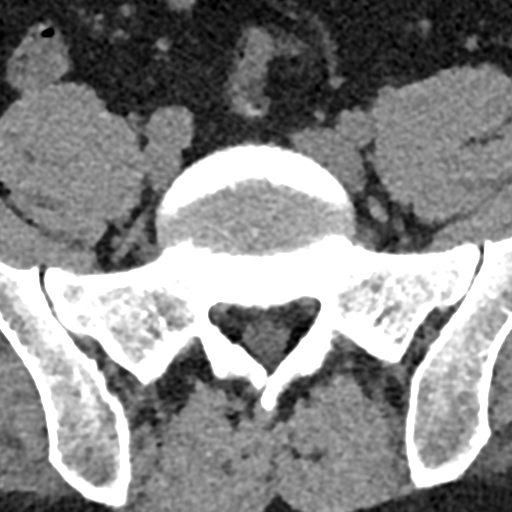
[im 20/119  bone]
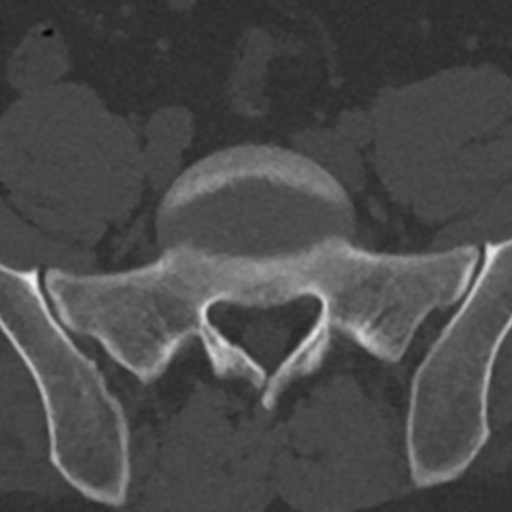
[im 40/119  bone]
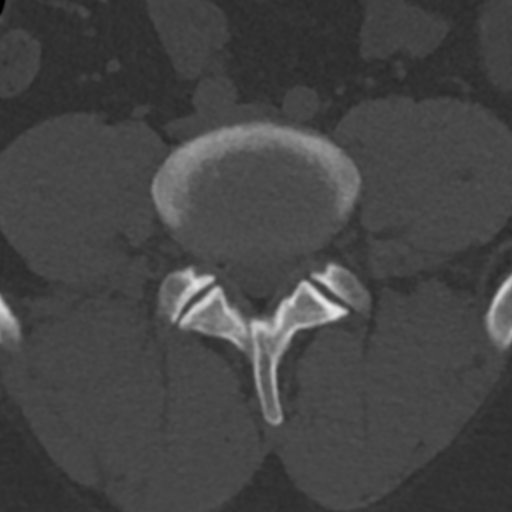
[im 60/119  bone]
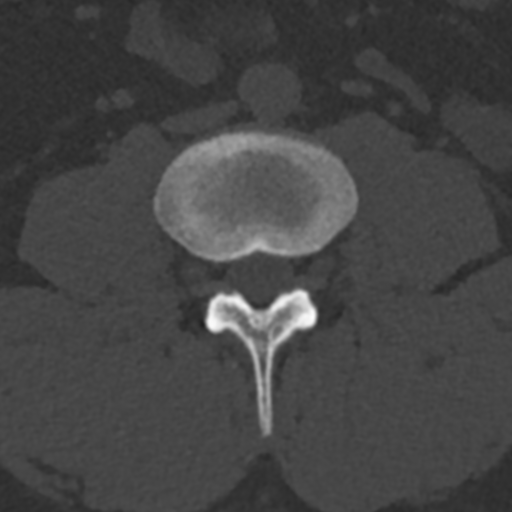
[im 79/119  bone]
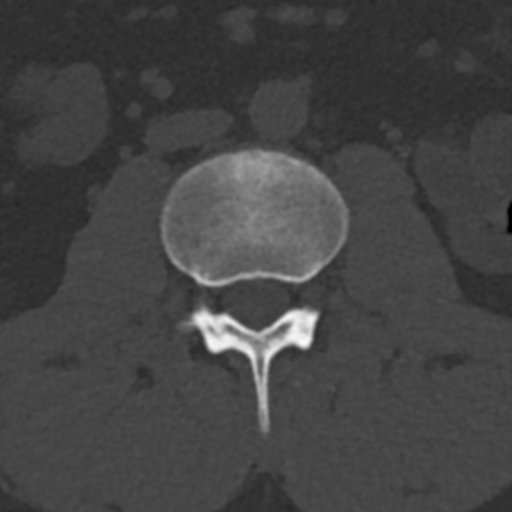
[im 99/119  soft-tissue]
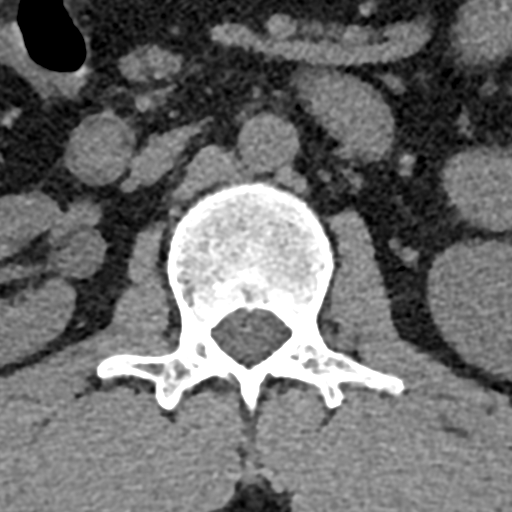
[im 99/119  bone]
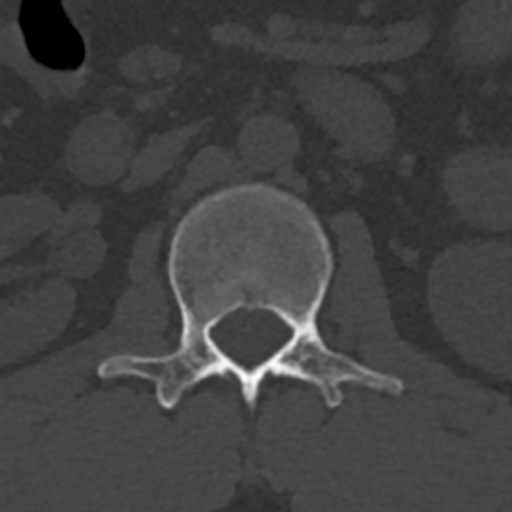

[Series 9: coronal st · coronal · 0.35mm/px · 3 of 61 slices shown]
[im 13/61  bone]
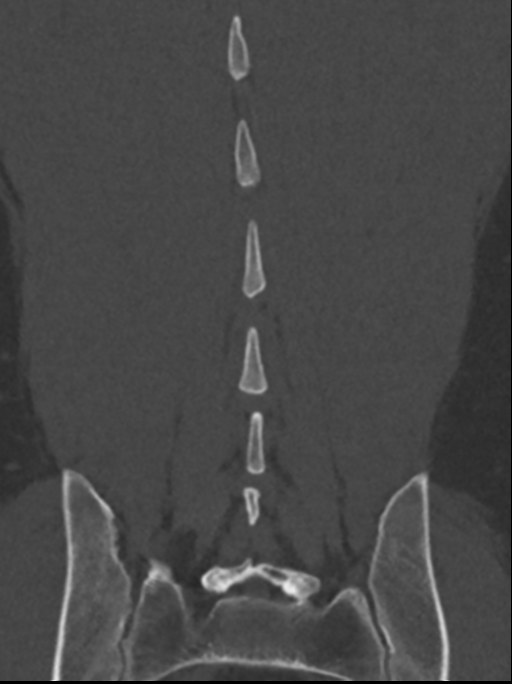
[im 25/61  bone]
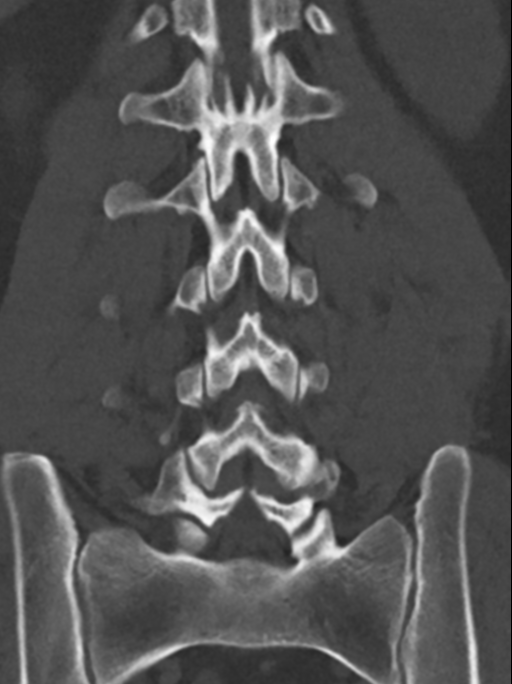
[im 37/61  bone]
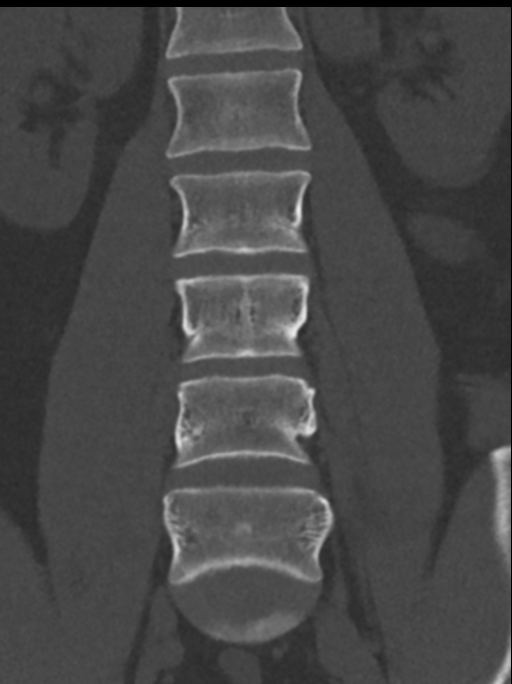

[Series 10: sagittal st · sagittal · 0.35mm/px · 5 of 61 slices shown, 6 images]
[im 21/61  bone]
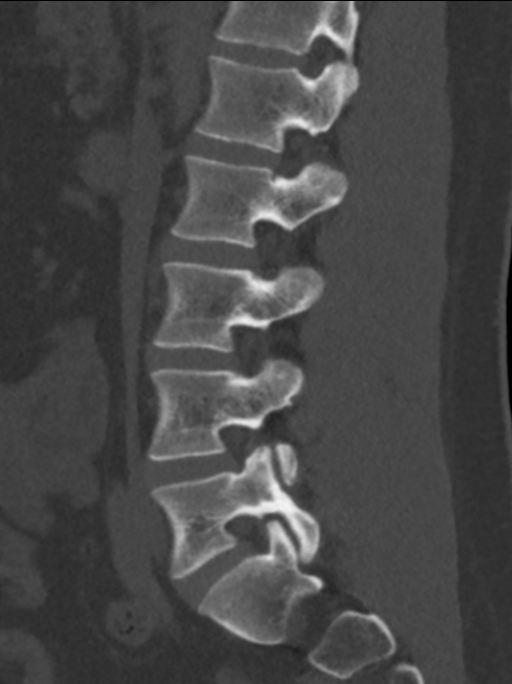
[im 26/61  bone]
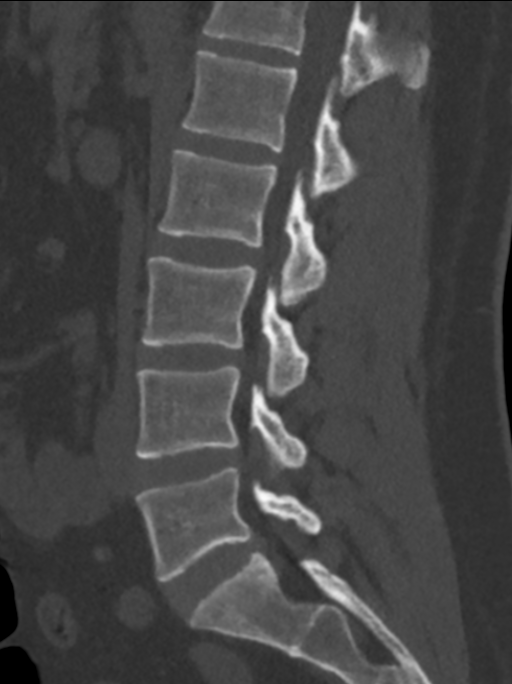
[im 31/61  soft-tissue]
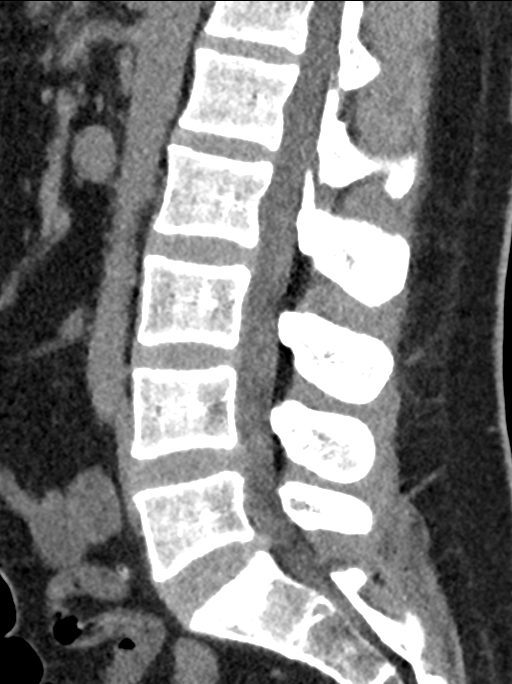
[im 31/61  bone]
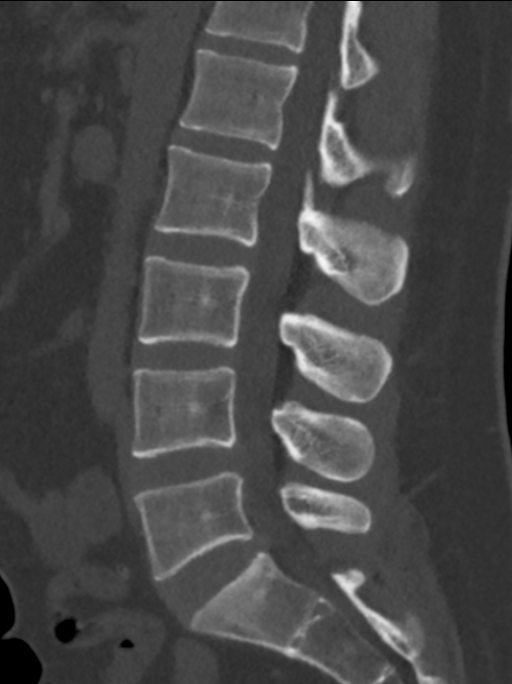
[im 36/61  bone]
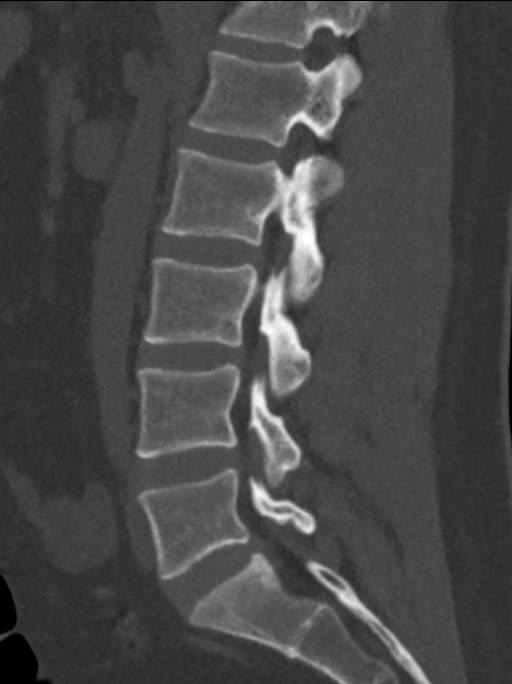
[im 41/61  bone]
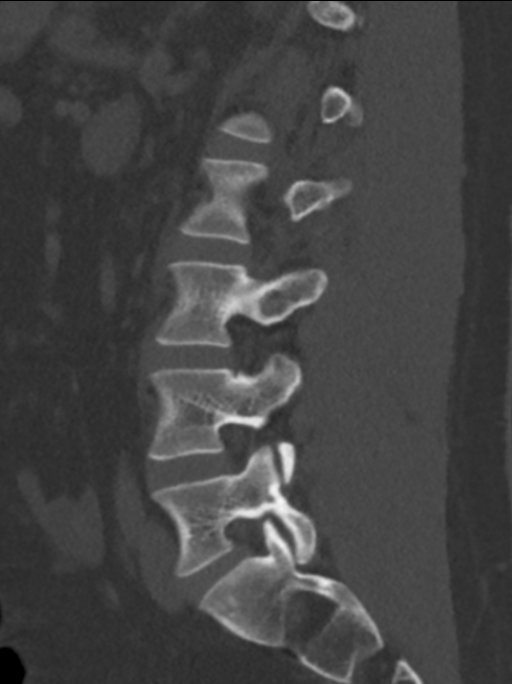

[13 of 33 positions shown; findings below may reference images not displayed]

FINDINGS: The vertebral bodies are normally aligned with preservation of the
normal lumbar lordosis. Vertebral body heights are well preserved.
No acute fracture or listhesis.

Paraspinous soft tissues are within normal limits. The visualized
visceral structures are unremarkable.

Mild degenerative disc bulge present at L5-S1 without significant
stenosis. Otherwise, no significant degenerative changes seen within
the lumbar spine.
IMPRESSION: 1. No acute fracture or listhesis within the lumbar spine.
2. Mild diffuse degenerative disc bulge at L5-S1 without significant
stenosis. No other significant degenerative changes within the
lumbar spine. No evidence for cord compression.
